# Patient Record
Sex: Male | Born: 1980 | Race: White | Hispanic: No | Marital: Single | Smoking: Current every day smoker
Health system: Southern US, Community
[De-identification: ages and names within clinical notes are randomized; demographics above are authoritative.]

---

## 2004-10-23 ENCOUNTER — Emergency Department: Payer: Self-pay | Admitting: Emergency Medicine

## 2005-11-13 ENCOUNTER — Emergency Department: Payer: Self-pay | Admitting: Unknown Physician Specialty

## 2010-01-25 ENCOUNTER — Emergency Department: Payer: Self-pay | Admitting: Emergency Medicine

## 2010-02-12 ENCOUNTER — Emergency Department: Payer: Self-pay | Admitting: Emergency Medicine

## 2010-02-14 ENCOUNTER — Emergency Department: Payer: Self-pay | Admitting: Emergency Medicine

## 2010-05-03 ENCOUNTER — Emergency Department: Payer: Self-pay | Admitting: Emergency Medicine

## 2010-07-27 ENCOUNTER — Emergency Department: Payer: Self-pay | Admitting: Unknown Physician Specialty

## 2011-01-19 ENCOUNTER — Emergency Department: Payer: Self-pay | Admitting: Emergency Medicine

## 2011-03-18 ENCOUNTER — Emergency Department: Payer: Self-pay | Admitting: Emergency Medicine

## 2012-08-01 ENCOUNTER — Emergency Department: Payer: Self-pay | Admitting: Emergency Medicine

## 2012-08-01 LAB — URINALYSIS, COMPLETE
Bacteria: NONE SEEN
Bilirubin,UR: NEGATIVE
Blood: NEGATIVE
Glucose,UR: NEGATIVE mg/dL (ref 0–75)
Leukocyte Esterase: NEGATIVE
Nitrite: NEGATIVE
Ph: 6 (ref 4.5–8.0)
Protein: NEGATIVE
RBC,UR: 1 /HPF (ref 0–5)
Specific Gravity: 1.026 (ref 1.003–1.030)
Squamous Epithelial: NONE SEEN
WBC UR: 1 /HPF (ref 0–5)

## 2012-08-01 LAB — COMPREHENSIVE METABOLIC PANEL
Albumin: 4.3 g/dL (ref 3.4–5.0)
Alkaline Phosphatase: 80 U/L (ref 50–136)
Anion Gap: 7 (ref 7–16)
BUN: 12 mg/dL (ref 7–18)
Bilirubin,Total: 0.6 mg/dL (ref 0.2–1.0)
Calcium, Total: 9.4 mg/dL (ref 8.5–10.1)
Chloride: 103 mmol/L (ref 98–107)
Co2: 28 mmol/L (ref 21–32)
Creatinine: 0.89 mg/dL (ref 0.60–1.30)
EGFR (African American): >60
EGFR (Non-African Amer.): >60
Glucose: 111 mg/dL — ABNORMAL HIGH (ref 65–99)
Osmolality: 276 (ref 275–301)
Potassium: 3.6 mmol/L (ref 3.5–5.1)
SGOT(AST): 15 U/L (ref 15–37)
SGPT (ALT): 24 U/L (ref 12–78)
Sodium: 138 mmol/L (ref 136–145)
Total Protein: 8.3 g/dL — ABNORMAL HIGH (ref 6.4–8.2)

## 2012-08-01 LAB — CBC WITH DIFFERENTIAL/PLATELET
Basophil #: 0 10*3/uL (ref 0.0–0.1)
Basophil %: 0.3 %
Eosinophil #: 0.1 10*3/uL (ref 0.0–0.7)
Eosinophil %: 0.8 %
HCT: 48.5 % (ref 40.0–52.0)
HGB: 16.7 g/dL (ref 13.0–18.0)
Lymphocyte #: 1.1 10*3/uL (ref 1.0–3.6)
Lymphocyte %: 11.3 %
MCH: 30.4 pg (ref 26.0–34.0)
MCHC: 34.5 g/dL (ref 32.0–36.0)
MCV: 88 fL (ref 80–100)
Monocyte #: 0.5 x10 3/mm (ref 0.2–1.0)
Monocyte %: 5.4 %
Neutrophil #: 7.7 10*3/uL — ABNORMAL HIGH (ref 1.4–6.5)
Neutrophil %: 82.2 %
Platelet: 284 10*3/uL (ref 150–440)
RBC: 5.5 10*6/uL (ref 4.40–5.90)
RDW: 13.2 % (ref 11.5–14.5)
WBC: 9.4 10*3/uL (ref 3.8–10.6)

## 2012-08-01 LAB — LIPASE, BLOOD: Lipase: 70 U/L — ABNORMAL LOW (ref 73–393)

## 2013-12-29 ENCOUNTER — Emergency Department: Payer: Self-pay | Admitting: Emergency Medicine

## 2013-12-29 LAB — D-DIMER(ARMC): D-Dimer: 1018 ng/ml

## 2013-12-29 LAB — URINALYSIS, COMPLETE
Bacteria: NONE SEEN
Bilirubin,UR: NEGATIVE
Blood: NEGATIVE
Glucose,UR: NEGATIVE mg/dL (ref 0–75)
Ketone: NEGATIVE
Leukocyte Esterase: NEGATIVE
Nitrite: NEGATIVE
Ph: 5 (ref 4.5–8.0)
Protein: NEGATIVE
RBC,UR: 3 /HPF (ref 0–5)
Specific Gravity: 1.028 (ref 1.003–1.030)
Squamous Epithelial: NONE SEEN
WBC UR: 1 /HPF (ref 0–5)

## 2013-12-29 LAB — BASIC METABOLIC PANEL
Anion Gap: 4 — ABNORMAL LOW (ref 7–16)
BUN: 12 mg/dL (ref 7–18)
Calcium, Total: 8.6 mg/dL (ref 8.5–10.1)
Chloride: 106 mmol/L (ref 98–107)
Co2: 30 mmol/L (ref 21–32)
Creatinine: 1.14 mg/dL (ref 0.60–1.30)
EGFR (African American): >60
EGFR (Non-African Amer.): >60
Glucose: 93 mg/dL (ref 65–99)
Osmolality: 279 (ref 275–301)
Potassium: 3.7 mmol/L (ref 3.5–5.1)
Sodium: 140 mmol/L (ref 136–145)

## 2013-12-29 LAB — CBC
HCT: 44.1 % (ref 40.0–52.0)
HGB: 14.9 g/dL (ref 13.0–18.0)
MCH: 30.6 pg (ref 26.0–34.0)
MCHC: 33.8 g/dL (ref 32.0–36.0)
MCV: 91 fL (ref 80–100)
Platelet: 242 10*3/uL (ref 150–440)
RBC: 4.87 10*6/uL (ref 4.40–5.90)
RDW: 13.6 % (ref 11.5–14.5)
WBC: 8.1 10*3/uL (ref 3.8–10.6)

## 2013-12-29 LAB — TROPONIN I
Troponin-I: 0.04 ng/mL
Troponin-I: 0.04 ng/mL

## 2013-12-29 LAB — CK TOTAL AND CKMB (NOT AT ARMC)
CK, Total: 108 U/L
CK-MB: 1.1 ng/mL (ref 0.5–3.6)

## 2014-03-23 ENCOUNTER — Emergency Department: Payer: Self-pay | Admitting: Emergency Medicine

## 2014-03-23 LAB — CBC WITH DIFFERENTIAL/PLATELET
Basophil #: 0 10*3/uL (ref 0.0–0.1)
Basophil %: 0.4 %
Eosinophil #: 0.4 10*3/uL (ref 0.0–0.7)
Eosinophil %: 4.1 %
HCT: 45.7 % (ref 40.0–52.0)
HGB: 15.1 g/dL (ref 13.0–18.0)
Lymphocyte #: 2.6 10*3/uL (ref 1.0–3.6)
Lymphocyte %: 29 %
MCH: 29.8 pg (ref 26.0–34.0)
MCHC: 33.1 g/dL (ref 32.0–36.0)
MCV: 90 fL (ref 80–100)
Monocyte #: 0.9 x10 3/mm (ref 0.2–1.0)
Monocyte %: 10.6 %
Neutrophil #: 5 10*3/uL (ref 1.4–6.5)
Neutrophil %: 55.9 %
Platelet: 276 10*3/uL (ref 150–440)
RBC: 5.07 10*6/uL (ref 4.40–5.90)
RDW: 14 % (ref 11.5–14.5)
WBC: 8.9 10*3/uL (ref 3.8–10.6)

## 2014-03-23 LAB — BASIC METABOLIC PANEL
Anion Gap: 8 (ref 7–16)
BUN: 21 mg/dL — ABNORMAL HIGH (ref 7–18)
Calcium, Total: 8.9 mg/dL (ref 8.5–10.1)
Chloride: 100 mmol/L (ref 98–107)
Co2: 27 mmol/L (ref 21–32)
Creatinine: 1.09 mg/dL (ref 0.60–1.30)
EGFR (African American): >60
EGFR (Non-African Amer.): >60
Glucose: 116 mg/dL — ABNORMAL HIGH (ref 65–99)
Osmolality: 274 (ref 275–301)
Potassium: 3.5 mmol/L (ref 3.5–5.1)
Sodium: 135 mmol/L — ABNORMAL LOW (ref 136–145)

## 2014-03-23 LAB — URINALYSIS, COMPLETE
Bilirubin,UR: NEGATIVE
Blood: NEGATIVE
Glucose,UR: NEGATIVE mg/dL (ref 0–75)
Ketone: NEGATIVE
Leukocyte Esterase: NEGATIVE
Nitrite: NEGATIVE
Ph: 5 (ref 4.5–8.0)
Protein: NEGATIVE
RBC,UR: 2 /HPF (ref 0–5)
Specific Gravity: 1.032 (ref 1.003–1.030)
Squamous Epithelial: NONE SEEN
WBC UR: 6 /HPF (ref 0–5)

## 2014-05-19 ENCOUNTER — Emergency Department: Payer: Self-pay | Admitting: Emergency Medicine

## 2014-05-19 LAB — BASIC METABOLIC PANEL
Anion Gap: 7 (ref 7–16)
BUN: 10 mg/dL (ref 7–18)
Calcium, Total: 9 mg/dL (ref 8.5–10.1)
Chloride: 106 mmol/L (ref 98–107)
Co2: 28 mmol/L (ref 21–32)
Creatinine: 0.93 mg/dL (ref 0.60–1.30)
EGFR (African American): >60
EGFR (Non-African Amer.): >60
Glucose: 83 mg/dL (ref 65–99)
Osmolality: 279 (ref 275–301)
Potassium: 3.9 mmol/L (ref 3.5–5.1)
Sodium: 141 mmol/L (ref 136–145)

## 2014-05-19 LAB — URINALYSIS, COMPLETE
Bilirubin,UR: NEGATIVE
Glucose,UR: NEGATIVE mg/dL (ref 0–75)
Ketone: NEGATIVE
Leukocyte Esterase: NEGATIVE
Nitrite: NEGATIVE
Ph: 5 (ref 4.5–8.0)
Protein: NEGATIVE
RBC,UR: 217 /HPF (ref 0–5)
Specific Gravity: 1.018 (ref 1.003–1.030)
Squamous Epithelial: 2
WBC UR: 5 /HPF (ref 0–5)

## 2014-05-19 LAB — GC/CHLAMYDIA PROBE AMP

## 2014-05-19 LAB — CBC
HCT: 49 % (ref 40.0–52.0)
HGB: 16.1 g/dL (ref 13.0–18.0)
MCH: 30.3 pg (ref 26.0–34.0)
MCHC: 33 g/dL (ref 32.0–36.0)
MCV: 92 fL (ref 80–100)
Platelet: 241 10*3/uL (ref 150–440)
RBC: 5.34 10*6/uL (ref 4.40–5.90)
RDW: 13.2 % (ref 11.5–14.5)
WBC: 6 10*3/uL (ref 3.8–10.6)

## 2014-05-19 LAB — CK: CK, Total: 77 U/L

## 2014-05-21 LAB — URINE CULTURE

## 2015-01-15 ENCOUNTER — Emergency Department
Admit: 2015-01-15 | Disposition: A | Discharge status: Home / Self Care | Payer: Self-pay | Admitting: Emergency Medicine

## 2015-04-12 ENCOUNTER — Emergency Department
Admission: EM | Admit: 2015-04-12 | Discharge: 2015-04-12 | Disposition: A | Discharge status: Home / Self Care | Payer: Self-pay | Attending: Emergency Medicine | Admitting: Emergency Medicine

## 2015-04-12 ENCOUNTER — Encounter: Payer: Self-pay | Admitting: Emergency Medicine

## 2015-04-12 DIAGNOSIS — Y288XXA Contact with other sharp object, undetermined intent, initial encounter: Secondary | ICD-10-CM | POA: Insufficient documentation

## 2015-04-12 DIAGNOSIS — Y998 Other external cause status: Secondary | ICD-10-CM | POA: Insufficient documentation

## 2015-04-12 DIAGNOSIS — Y9289 Other specified places as the place of occurrence of the external cause: Secondary | ICD-10-CM | POA: Insufficient documentation

## 2015-04-12 DIAGNOSIS — S51011A Laceration without foreign body of right elbow, initial encounter: Secondary | ICD-10-CM | POA: Insufficient documentation

## 2015-04-12 DIAGNOSIS — Y9389 Activity, other specified: Secondary | ICD-10-CM | POA: Insufficient documentation

## 2015-04-12 MED ORDER — BACITRACIN-NEOMYCIN-POLYMYXIN 400-5-5000 EX OINT
TOPICAL_OINTMENT | CUTANEOUS | Status: AC
  Administered 2015-04-12: 1 via TOPICAL
  Filled 2015-04-12: qty 1

## 2015-04-12 MED ORDER — TRIPLE ANTIBIOTIC 3.5-400-5000 EX OINT
TOPICAL_OINTMENT | Freq: Once | CUTANEOUS | Status: AC
  Administered 2015-04-12: 1 via TOPICAL

## 2015-04-12 MED ORDER — OXYCODONE-ACETAMINOPHEN 5-325 MG PO TABS
ORAL_TABLET | ORAL | Status: AC
  Administered 2015-04-12: 1 via ORAL
  Filled 2015-04-12: qty 1

## 2015-04-12 MED ORDER — TRAMADOL HCL 50 MG PO TABS: 50.0000 mg | ORAL_TABLET | Freq: Four times a day (QID) | ORAL | Status: DC | PRN

## 2015-04-12 MED ORDER — IBUPROFEN 800 MG PO TABS: 800.0000 mg | ORAL_TABLET | Freq: Three times a day (TID) | ORAL | Status: DC | PRN

## 2015-04-12 MED ORDER — OXYCODONE-ACETAMINOPHEN 5-325 MG PO TABS
1.0000 | ORAL_TABLET | Freq: Once | ORAL | Status: AC
  Administered 2015-04-12: 1 via ORAL

## 2015-04-12 MED ORDER — LIDOCAINE-EPINEPHRINE (PF) 1 %-1:200000 IJ SOLN
INTRAMUSCULAR | Status: AC
  Filled 2015-04-12: qty 30

## 2015-04-12 NOTE — ED Provider Notes (Signed)
[facility] Emergency Department Provider Note  ____________________________________________  Time seen: Approximately 2:06 PM  I have reviewed the triage vital signs and the nursing notes.   HISTORY  Chief Complaint Extremity Laceration    HPI Judeth HornJason A Park is a 34 y.o. male right lateral posterior elbow laceration. Area was cut with a weed trimmer blade. He which is controlled with direct pressure. Patient denies any loss of sensation or loss of function of the affected extremity. Patient is rating his pain as 7/10.   History reviewed. No pertinent past medical history.  There are no active problems to display for this patient.   History reviewed. No pertinent past surgical history.  No current outpatient prescriptions on file.  Allergies Review of patient's allergies indicates no known allergies.  No family history on file.  Social History History  Substance Use Topics  . Smoking status: Not on file  . Smokeless tobacco: Not on file  . Alcohol Use: Not on file    Review of Systems Constitutional: No fever/chills Eyes: No visual changes. ENT: No sore throat. Cardiovascular: Denies chest pain. Respiratory: Denies shortness of breath. Gastrointestinal: No abdominal pain.  No nausea, no vomiting.  No diarrhea.  No constipation. Genitourinary: Negative for dysuria. Musculoskeletal: Negative for back pain. Skin: Laceration to the right elbow. Neurological: Negative for headaches, focal weakness or numbness. 10-point ROS otherwise negative.  ____________________________________________   PHYSICAL EXAM:  VITAL SIGNS: ED Triage Vitals  Enc Vitals Group     BP 04/12/15 1403 122/67 mmHg     Pulse Rate 04/12/15 1403 80     Resp 04/12/15 1403 18     Temp 04/12/15 1403 98.2 F (36.8 C)     Temp Source 04/12/15 1403 Oral     SpO2 04/12/15 1403 100 %     Weight 04/12/15 1403 196 lb (88.905 kg)     Height 04/12/15 1403 6\' 2"  (1.88 m)     Head Cir --      Peak Flow --      Pain Score 04/12/15 1404 7     Pain Loc --      Pain Edu? --      Excl. in GC? --    Constitutional: Alert and oriented. Well appearing and in no acute distress. Eyes: Conjunctivae are normal. PERRL. EOMI. Head: Atraumatic. Nose: No congestion/rhinnorhea. Mouth/Throat: Mucous membranes are moist.  Oropharynx non-erythematous. Neck: No stridor. No cervical spine tenderness to palpation. Hematological/Lymphatic/Immunilogical: No cervical lymphadenopathy. Cardiovascular: Normal rate, regular rhythm. Grossly normal heart sounds.  Good peripheral circulation. Respiratory: Normal respiratory effort.  No retractions. Lungs CTAB. Gastrointestinal: Soft and nontender. No distention. No abdominal bruits. No CVA tenderness. Musculoskeletal: No lower extremity tenderness nor edema.  No joint effusions. Neurologic:  Normal speech and language. No gross focal neurologic deficits are appreciated. Speech is normal. No gait instability. Skin:  Skin is warm, dry and intact. No rash noted. 3 center laceration to the right lateral posterior elbow. Extremities neurovascularly intact free nuchal range of motion. No hemorrhaging at this time. Psychiatric: Mood and affect are normal. Speech and behavior are normal.  ____________________________________________   LABS (all labs ordered are listed, but only abnormal results are displayed)  Labs Reviewed - No data to display ____________________________________________  EKG   ____________________________________________  RADIOLOGY   ____________________________________________   PROCEDURES  Procedure(s) performed: None  Critical Care performed: No  ____________________________________________   INITIAL IMPRESSION / ASSESSMENT AND PLAN / ED COURSE  Pertinent labs & imaging results  that were available during my care of the patient were reviewed by me and considered in my medical decision making (see chart  for details).  LACERATION REPAIR PerformedJoni Reining K Smith Authorized by: Joni Reining Consent: Verbal consent obtained. Risks and benefits: risks, benefits and alternatives were discussed Consent given by: patient Patient identity confirmed: provided demographic data Prepped and Draped in normal sterile fashion Wound explored  Laceration Location: Right lateral posterior elbow  Laceration Length: 3 cm  No Foreign Bodies seen or palpated  Anesthesia: local infiltration  Local anesthetic: lidocaine1% with epinephrine  Anesthetic total: 6 ml  Irrigation method: syringe Amount of cleaning: standard  Skin closure 3-0 nylon  Number of sutures: 8  Technique: Interrupted Patient tolerance: Patient tolerated the procedure well with no immediate complications.  ____________________________________________   FINAL CLINICAL IMPRESSION(S) / ED DIAGNOSES  Final diagnoses:  Elbow laceration, right, initial encounter      Joni Reining, PA-C 04/12/15 1431  Myrna Blazer, MD 04/12/15 [phone]

## 2015-04-12 NOTE — ED Notes (Signed)
D/c instructions reviewed w/ pt - pt denies any further questions or concerns at present.  Pt instructed to not use alcohol, drive, or operate heavy machinery while take the prescription pain medications as they could make him drowsy - pt verbalized understanding.   

## 2015-04-12 NOTE — ED Notes (Signed)
Dressed wound with tefla and kerlex

## 2015-04-12 NOTE — ED Notes (Signed)
From home: right lateral/posterior elbow lac 3 cm, deep, bleeding controlled, Ron at bedside suturing.  Lacerated with weed trimmer blade, was trimming high limbs over fence.  VSS

## 2015-04-22 ENCOUNTER — Emergency Department
Admission: EM | Admit: 2015-04-22 | Discharge: 2015-04-22 | Disposition: A | Discharge status: Home / Self Care | Payer: Self-pay | Attending: Emergency Medicine | Admitting: Emergency Medicine

## 2015-04-22 DIAGNOSIS — Z4802 Encounter for removal of sutures: Secondary | ICD-10-CM | POA: Insufficient documentation

## 2015-04-22 NOTE — ED Provider Notes (Signed)
[facility] Emergency Department Provider Note  ____________________________________________  Time seen:  2:27 PM  I have reviewed the triage vital signs and the nursing notes.   HISTORY  Chief Complaint No chief complaint on file.    HPI Marcus Park is a 34 y.o. male is here for suture removal. He was here on 04/12/15 for laceration to his right posterior elbow. He states he has not had any problems with this. He denies any pain or fever.   No past medical history on file.  There are no active problems to display for this patient.   No past surgical history on file.  Current Outpatient Rx  Name  Route  Sig  Dispense  Refill  . ibuprofen (ADVIL,MOTRIN) 800 MG tablet   Oral   Take 1 tablet (800 mg total) by mouth every 8 (eight) hours as needed for moderate pain.   15 tablet   0   . traMADol (ULTRAM) 50 MG tablet   Oral   Take 1 tablet (50 mg total) by mouth every 6 (six) hours as needed for moderate pain.   12 tablet   0     Allergies Review of patient's allergies indicates no known allergies.  No family history on file.  Social History History  Substance Use Topics  . Smoking status: Not on file  . Smokeless tobacco: Not on file  . Alcohol Use: Not on file    Review of Systems Constitutional: No fever/chills   10-point ROS otherwise negative.  ____________________________________________   PHYSICAL EXAM:  VITAL SIGNS: ED Triage Vitals  Enc Vitals Group     BP 04/22/15 1419 115/64 mmHg     Pulse Rate 04/22/15 1419 80     Resp 04/22/15 1419 18     Temp 04/22/15 1419 98 F (36.7 C)     Temp Source 04/22/15 1419 Oral     SpO2 04/22/15 1419 98 %     Weight 04/22/15 1419 196 lb (88.905 kg)     Height 04/22/15 1419  (1.854 m)     Head Cir --      Peak Flow --      Pain Score --      Pain Loc --      Pain Edu? --      Excl. in GC? --     Constitutional: Alert and oriented. Well appearing and in no acute  distress. Eyes: Conjunctivae are normal. PERRL. EOMI. Head: Atraumatic. Nose: No congestion/rhinnorhea. Neck: No stridor.   Musculoskeletal: No lower extremity tenderness nor edema.  No joint effusions. Elbow within normal limits range of motion. Neurologic:  Normal speech and language. No gross focal neurologic deficits are appreciated. Speech is normal. No gait instability. Skin:  Skin is warm, dry and intact. No rash noted. Area feels to have healed without any signs of infection. Psychiatric: Mood and affect are normal. Speech and behavior are normal.  ____________________________________________   LABS (all labs ordered are listed, but only abnormal results are displayed)  Labs Reviewed - No data to display PROCEDURES  Procedure(s) performed: None  Critical Care performed: No  ____________________________________________   INITIAL IMPRESSION / ASSESSMENT AND PLAN / ED COURSE  Pertinent labs & imaging results that were available during my care of the patient were reviewed by me and considered in my medical decision making (see chart for details).  Sutures were removed by RN. Patient is return if any continued problems. ____________________________________________   FINAL CLINICAL IMPRESSION(S) /  ED DIAGNOSES  Final diagnoses:  Visit for suture removal      Tommi RumpsRhonda L Carvel Huskins, PA-C 04/22/15 1434  Phineas SemenGraydon Goodman, MD 04/22/15 1447

## 2015-04-22 NOTE — Discharge Instructions (Signed)

## 2015-04-22 NOTE — ED Notes (Signed)
After sutures removed, steri strips applied

## 2015-04-23 ENCOUNTER — Encounter: Payer: Self-pay | Admitting: Emergency Medicine

## 2015-04-23 ENCOUNTER — Emergency Department
Admission: EM | Admit: 2015-04-23 | Discharge: 2015-04-24 | Disposition: A | Discharge status: Home / Self Care | Payer: Self-pay | Attending: Emergency Medicine | Admitting: Emergency Medicine

## 2015-04-23 DIAGNOSIS — Y93E1 Activity, personal bathing and showering: Secondary | ICD-10-CM | POA: Insufficient documentation

## 2015-04-23 DIAGNOSIS — Y998 Other external cause status: Secondary | ICD-10-CM | POA: Insufficient documentation

## 2015-04-23 DIAGNOSIS — L539 Erythematous condition, unspecified: Secondary | ICD-10-CM | POA: Insufficient documentation

## 2015-04-23 DIAGNOSIS — R55 Syncope and collapse: Secondary | ICD-10-CM | POA: Insufficient documentation

## 2015-04-23 DIAGNOSIS — S300XXA Contusion of lower back and pelvis, initial encounter: Secondary | ICD-10-CM | POA: Insufficient documentation

## 2015-04-23 DIAGNOSIS — W182XXA Fall in (into) shower or empty bathtub, initial encounter: Secondary | ICD-10-CM | POA: Insufficient documentation

## 2015-04-23 DIAGNOSIS — Z720 Tobacco use: Secondary | ICD-10-CM | POA: Insufficient documentation

## 2015-04-23 DIAGNOSIS — Y9289 Other specified places as the place of occurrence of the external cause: Secondary | ICD-10-CM | POA: Insufficient documentation

## 2015-04-23 DIAGNOSIS — S20211A Contusion of right front wall of thorax, initial encounter: Secondary | ICD-10-CM | POA: Insufficient documentation

## 2015-04-23 LAB — BASIC METABOLIC PANEL
Anion gap: 13 (ref 5–15)
BUN: 14 mg/dL (ref 6–20)
CO2: 23 mmol/L (ref 22–32)
Calcium: 9.4 mg/dL (ref 8.9–10.3)
Chloride: 100 mmol/L — ABNORMAL LOW (ref 101–111)
Creatinine, Ser: 1.21 mg/dL (ref 0.61–1.24)
GFR calc Af Amer: >60 mL/min (ref >60)
GFR calc non Af Amer: >60 mL/min (ref >60)
Glucose, Bld: 93 mg/dL (ref 65–99)
Potassium: 3.5 mmol/L (ref 3.5–5.1)
Sodium: 136 mmol/L (ref 135–145)

## 2015-04-23 LAB — URINALYSIS COMPLETE WITH MICROSCOPIC (ARMC ONLY)
Bilirubin Urine: NEGATIVE
Glucose, UA: NEGATIVE mg/dL
Hgb urine dipstick: NEGATIVE
Leukocytes, UA: NEGATIVE
Nitrite: NEGATIVE
Protein, ur: 100 mg/dL — AB
Specific Gravity, Urine: 1.028 (ref 1.005–1.030)
pH: 5 (ref 5.0–8.0)

## 2015-04-23 LAB — CBC
HCT: 48.6 % (ref 40.0–52.0)
Hemoglobin: 16.4 g/dL (ref 13.0–18.0)
MCH: 29.9 pg (ref 26.0–34.0)
MCHC: 33.8 g/dL (ref 32.0–36.0)
MCV: 88.6 fL (ref 80.0–100.0)
Platelets: 286 10*3/uL (ref 150–440)
RBC: 5.49 MIL/uL (ref 4.40–5.90)
RDW: 13.9 % (ref 11.5–14.5)
WBC: 10.8 10*3/uL — ABNORMAL HIGH (ref 3.8–10.6)

## 2015-04-23 LAB — GLUCOSE, CAPILLARY: Glucose-Capillary: 90 mg/dL (ref 65–99)

## 2015-04-23 MED ORDER — OXYCODONE-ACETAMINOPHEN 5-325 MG PO TABS
2.0000 | ORAL_TABLET | Freq: Once | ORAL | Status: AC
Start: 2015-04-23 — End: 2015-04-23
  Administered 2015-04-23: 2 via ORAL
  Filled 2015-04-23: qty 2

## 2015-04-23 MED ORDER — SODIUM CHLORIDE 0.9 % IV BOLUS (SEPSIS)
1000.0000 mL | Freq: Once | INTRAVENOUS | Status: AC
  Administered 2015-04-23: 1000 mL via INTRAVENOUS

## 2015-04-23 MED ORDER — HYDROCODONE-ACETAMINOPHEN 5-325 MG PO TABS: 1.0000 | ORAL_TABLET | ORAL | Status: DC | PRN

## 2015-04-23 MED ORDER — ONDANSETRON HCL 4 MG/2ML IJ SOLN
4.0000 mg | Freq: Once | INTRAMUSCULAR | Status: AC
  Administered 2015-04-23: 4 mg via INTRAVENOUS
  Filled 2015-04-23: qty 2

## 2015-04-23 NOTE — ED Notes (Addendum)
Pt to rm 8 via EMS.  EMS reports syncopal episode.  Pt reports he felt lightheaded before episode.  Pt was in bathroom and fell.  Pt reports per girlfriend, only out few seconds.  Pt reports pain to right back and right shoulder blade, small red area noted on right back.  VS stable en route, CBG w/ EMS 63.  Pt reports he was in the sun a lot today and did not eat very much.  Pt NAD at this time.

## 2015-04-23 NOTE — Discharge Instructions (Signed)
Syncope Syncope means a person passes out (faints). The person usually wakes up in less than 5 minutes. It is important to seek medical care for syncope. HOME CARE  Have someone stay with you until you feel normal.  Do not drive, use machines, or play sports until your doctor says it is okay.  Keep all doctor visits as told.  Lie down when you feel like you might pass out. Take deep breaths. Wait until you feel normal before standing up.  Drink enough fluids to keep your pee (urine) clear or pale yellow.  If you take blood pressure or heart medicine, get up slowly. Take several minutes to sit and then stand. GET HELP RIGHT AWAY IF:   You have a severe headache.  You have pain in the chest, belly (abdomen), or back.  You are bleeding from the mouth or butt (rectum).  You have black or tarry poop (stool).  You have an irregular or very fast heartbeat.  You have pain with breathing.  You keep passing out, or you have shaking (seizures) when you pass out.  You pass out when sitting or lying down.  You feel confused.  You have trouble walking.  You have severe weakness.  You have vision problems. If you fainted, call your local emergency services (911 in [place] Do not drive yourself to the hospital. MAKE SURE YOU:   Understand these instructions.  Will watch your condition.  Will get help right away if you are not doing well or get worse. Document Released: 03/16/2008 Document Revised: 03/29/2012 Document Reviewed: 11/27/2011 [employer] Patient Information 2015 [employer] [place]. This information is not intended to replace advice given to you by your health care provider. Make sure you discuss any questions you have with your health care provider.    As we have discussed please drink plenty of fluids of the next several days. Please follow-up with her primary care provider this week for recheck. Return to the emergency department for any further syncopal episodes up (if  you pass out), or any chest pain, or shortness breath.

## 2015-04-23 NOTE — ED Provider Notes (Signed)
[facility] Emergency Department Provider Note  Time seen: 9:53 PM  I have reviewed the triage vital signs and the nursing notes.   HISTORY  Chief Complaint Loss of Consciousness    HPI Marcus Park is a 34 y.o. male with no past medical history who presents to the emergency department after a syncopal episode. According to the patient he had been out mowing lawns for the last 4-5 hours. He went to take a shower, and while entering the shower became very dizzy and lightheaded. He awoke on the floor with his girlfriend standing over him who states she heard a thump and came to the room. Patient believes he lost consciousness but states it was only brief. Denies any history of syncope in the past. Denies any chest pain or shortness of breath at any time. Denies any nausea at any time. Denies abdominal pain. Currently the patient's only complaint is of back pain where he has a bruise from the fall tonight. Denies any headache or head pain. Denies neck pain.     History reviewed. No pertinent past medical history.  There are no active problems to display for this patient.   History reviewed. No pertinent past surgical history.  Current Outpatient Rx  Name  Route  Sig  Dispense  Refill  . ibuprofen (ADVIL,MOTRIN) 800 MG tablet   Oral   Take 1 tablet (800 mg total) by mouth every 8 (eight) hours as needed for moderate pain.   15 tablet   0   . traMADol (ULTRAM) 50 MG tablet   Oral   Take 1 tablet (50 mg total) by mouth every 6 (six) hours as needed for moderate pain.   12 tablet   0     Allergies Review of patient's allergies indicates no known allergies.  History reviewed. No pertinent family history.  Social History History  Substance Use Topics  . Smoking status: Current Every Day Smoker -- 1.00 packs/day    Types: Cigarettes  . Smokeless tobacco: Never Used  . Alcohol Use: No    Review of Systems Constitutional: Negative for fever.  [place] for syncope tonight. Cardiovascular: Negative for chest pain. Respiratory: Negative for shortness of breath. Gastrointestinal: Negative for abdominal pain, vomiting and diarrhea. Musculoskeletal: Negative for back pain. Skin: Bruises to back.  10-point ROS otherwise negative.  ____________________________________________   PHYSICAL EXAM:  VITAL SIGNS: ED Triage Vitals  Enc Vitals Group     BP 04/23/15 2132 110/76 mmHg     Pulse Rate 04/23/15 2132 87     Resp 04/23/15 2132 18     Temp 04/23/15 2132 98 F (36.7 C)     Temp Source 04/23/15 2132 Oral     SpO2 04/23/15 2132 98 %     Weight 04/23/15 2132 196 lb (88.905 kg)     Height 04/23/15 2132 6\' 1"  (1.854 m)     Head Cir --      Peak Flow --      Pain Score 04/23/15 2132 7     Pain Loc --      Pain Edu? --      Excl. in GC? --     Constitutional: Alert and oriented. Well appearing and in no distress. Eyes: Normal exam ENT   Head: Normocephalic and atraumatic. No C-spine tenderness to palpation.   Mouth/Throat: Mucous membranes are moist. Cardiovascular: Normal rate, regular rhythm. No murmur Respiratory: Normal respiratory effort without tachypnea nor retractions. Breath sounds are clear. Patient does  have an area of tenderness with erythema consistent with contusion to the right posterior chest. No midline tenderness to palpation. Gastrointestinal: Soft and nontender. No distention.   Musculoskeletal: Nontender with normal range of motion in all extremities. No lower extremity tenderness or edema. Bruises to back as stated above. Neurologic:  Normal speech and language. No gross focal neurologic deficits Skin:  Skin is warm, dry and intact.  Psychiatric: Mood and affect are normal. Speech and behavior are normal.   ____________________________________________    EKG  EKG reviewed and interpreted by myself shows normal sinus rhythm at 81 bpm, narrow QRS, normal axis, normal intervals. The patient does  have ST elevations present in the 2 through V6. No reciprocal depressions. In reviewing the patient's history his EKG from 12/29/2013 shows a very similar appearance. As the patient has no chest pain, with noticeable changes, do not suspect ACS.  ____________________________________________    INITIAL IMPRESSION / ASSESSMENT AND PLAN / ED COURSE  Pertinent labs & imaging results that were available during my care of the patient were reviewed by me and considered in my medical decision making (see chart for details).  Patient with syncopal episode today after mowing lawns in the heat for 4-5 hours. We will check labs including cardiac enzymes) likely 2 sets), IV hydrate, and treat pain. Patient does have a notable bruise to the posterior right chest. No vertebral midline tenderness to palpation. Denies any headache, no neck pain. Do not believe the patient requires further imaging at this time.     ----------------------------------------- 11:23 PM on 04/23/2015 -----------------------------------------  Patient feeling much better after fluids. Labs are largely within normal limits. Currently awaiting troponin results, we will add on a second troponin at 12:30 AM. If normal we will discharge the patient home with primary care follow-up and pain medication for back pain. I discussed strict return precautions with the patient, as well as increasing by mouth fluids to greater than 64 ounces per day. Patient is agreeable to plan.  Patient care signed out to Dr. Zenda Alpers.  ____________________________________________   FINAL CLINICAL IMPRESSION(S) / ED DIAGNOSES  Syncope   Minna Antis, MD 04/23/15 2324

## 2015-04-24 LAB — TROPONIN I
Troponin I: <0.03 ng/mL (ref <0.031)
Troponin I: <0.03 ng/mL (ref <0.031)

## 2015-04-24 MED ORDER — OXYCODONE-ACETAMINOPHEN 5-325 MG PO TABS
ORAL_TABLET | ORAL | Status: AC
  Administered 2015-04-24: 1 via ORAL
  Filled 2015-04-24: qty 1

## 2015-04-24 MED ORDER — OXYCODONE-ACETAMINOPHEN 5-325 MG PO TABS
1.0000 | ORAL_TABLET | Freq: Once | ORAL | Status: AC
  Administered 2015-04-24: 1 via ORAL

## 2015-04-24 NOTE — ED Provider Notes (Signed)
-----------------------------------------   1:44 AM on 04/24/2015 -----------------------------------------  Assuming care from Dr. Lenard LancePaduchowski.  In short, Marcus Park is a 34 y.o. male with a chief complaint of Loss of Consciousness .  Refer to the original H&P for additional details.  The current plan of care is to follow-up the results of the patient's troponins, reassess him and discharged him if everything is normal..   [name] troponins were unremarkable. He'll be discharged home to follow-up. The patient did receive a dose of Percocet for his pain in his back.  Rebecka ApleyAllison P Webster, MD 04/24/15 [phone]

## 2015-05-02 ENCOUNTER — Encounter: Payer: Self-pay | Admitting: *Deleted

## 2015-05-02 ENCOUNTER — Emergency Department: Payer: Self-pay

## 2015-05-02 ENCOUNTER — Emergency Department
Admission: EM | Admit: 2015-05-02 | Discharge: 2015-05-02 | Disposition: A | Discharge status: Home / Self Care | Payer: Self-pay | Attending: Emergency Medicine | Admitting: Emergency Medicine

## 2015-05-02 DIAGNOSIS — W182XXA Fall in (into) shower or empty bathtub, initial encounter: Secondary | ICD-10-CM | POA: Insufficient documentation

## 2015-05-02 DIAGNOSIS — Z720 Tobacco use: Secondary | ICD-10-CM | POA: Insufficient documentation

## 2015-05-02 DIAGNOSIS — Y93E1 Activity, personal bathing and showering: Secondary | ICD-10-CM | POA: Insufficient documentation

## 2015-05-02 DIAGNOSIS — Y9289 Other specified places as the place of occurrence of the external cause: Secondary | ICD-10-CM | POA: Insufficient documentation

## 2015-05-02 DIAGNOSIS — Y998 Other external cause status: Secondary | ICD-10-CM | POA: Insufficient documentation

## 2015-05-02 DIAGNOSIS — R0602 Shortness of breath: Secondary | ICD-10-CM | POA: Insufficient documentation

## 2015-05-02 DIAGNOSIS — S298XXA Other specified injuries of thorax, initial encounter: Secondary | ICD-10-CM | POA: Insufficient documentation

## 2015-05-02 MED ORDER — OXYCODONE-ACETAMINOPHEN 5-325 MG PO TABS
2.0000 | ORAL_TABLET | Freq: Once | ORAL | Status: AC
Start: 2015-05-02 — End: 2015-05-02
  Administered 2015-05-02: 2 via ORAL
  Filled 2015-05-02: qty 2

## 2015-05-02 MED ORDER — IBUPROFEN 800 MG PO TABS
800.0000 mg | ORAL_TABLET | Freq: Three times a day (TID) | ORAL | Status: DC
Start: 2015-05-02 — End: 2020-04-18

## 2015-05-02 MED ORDER — OXYCODONE-ACETAMINOPHEN 5-325 MG PO TABS: 1.0000 | ORAL_TABLET | ORAL | Status: DC | PRN

## 2015-05-02 NOTE — ED Notes (Signed)
Pt to triage via wheelchair.  Pt has right mid back/side pain. Pt states he fell in the shower last week and struck the toilet.   Pt continues to have pain.  Pt was seen in the ER last week after the fall, states i'm not any better.

## 2015-05-02 NOTE — ED Provider Notes (Signed)
[facility] Emergency Department Provider Note  ____________________________________________  Time seen: Approximately 4:09 PM  I have reviewed the triage vital signs and the nursing notes.   HISTORY  Chief Complaint Back Pain   HPI Marcus Park is a 34 y.o. male is here today with complaint of right rib pain. Patient states he fell out of the shower last week and struck his chest on the toilet. He was seen in the emergency room on that day but did not have x-rays. He states that the ibuprofen helps some but nothing else seems to be helping his pain. Pain is right lateral without radiation. He continues to smoke cigarettes. Coughing and taking deep breaths as increases pain. Currently his pain is 9 out of 10.   No past medical history on file.  There are no active problems to display for this patient.   No past surgical history on file.  Current Outpatient Rx  Name  Route  Sig  Dispense  Refill  . ibuprofen (ADVIL,MOTRIN) 800 MG tablet   Oral   Take 1 tablet (800 mg total) by mouth 3 (three) times daily.   30 tablet   0   . oxyCODONE-acetaminophen (PERCOCET) 5-325 MG per tablet   Oral   Take 1 tablet by mouth every 4 (four) hours as needed for severe pain.   20 tablet   0     Allergies Review of patient's allergies indicates no known allergies.  No family history on file.  Social History History  Substance Use Topics  . Smoking status: Current Every Day Smoker -- 1.00 packs/day    Types: Cigarettes  . Smokeless tobacco: Never Used  . Alcohol Use: No    Review of Systems Constitutional: No fever/chills ENT: No sore throat. Cardiovascular: Positive right lateral chest pain. Respiratory: Positive shortness of breath secondary to chest pain. Gastrointestinal: No abdominal pain.  No nausea, no vomiting.  Genitourinary: Negative for dysuria. Musculoskeletal: Negative for back pain. Skin: Negative for rash. Neurological: Negative for  headaches, focal weakness or numbness.  10-point ROS otherwise negative.  ____________________________________________   PHYSICAL EXAM:  VITAL SIGNS: ED Triage Vitals  Enc Vitals Group     BP 05/02/15 1559 112/59 mmHg     Pulse Rate 05/02/15 1559 88     Resp 05/02/15 1559 22     Temp 05/02/15 1559 98.7 F (37.1 C)     Temp Source 05/02/15 1559 Oral     SpO2 05/02/15 1559 98 %     Weight 05/02/15 1559 196 lb (88.905 kg)     Height 05/02/15 1559  (1.854 m)     Head Cir --      Peak Flow --      Pain Score 05/02/15 1559 9     Pain Loc --      Pain Edu? --      Excl. in GC? --     Constitutional: Alert and oriented. Well appearing and in no acute distress. Eyes: Conjunctivae are normal. PERRL. EOMI. Head: Atraumatic. Nose: No congestion/rhinnorhea. Neck: No stridor.   Cardiovascular: Normal rate, regular rhythm. Grossly normal heart sounds.  Good peripheral circulation. Respiratory: Normal respiratory effort.  No retractions. Lungs CTAB. Gastrointestinal: Soft and nontender. No distention. Musculoskeletal: No lower extremity tenderness nor edema.  No joint effusions. Neurologic:  Normal speech and language. No gross focal neurologic deficits are appreciated. No gait instability. Skin:  Skin is warm, dry and intact. No rash noted. Psychiatric: Mood and affect  are normal. Speech and behavior are normal.  ____________________________________________   LABS (all labs ordered are listed, but only abnormal results are displayed)  Labs Reviewed - No data to display    RADIOLOGY  Right rib x-ray per radiologist and reviewed by me does show any rib fractures. No acute infiltrate or pulmonary edema. ____________________________________________   PROCEDURES  Procedure(s) performed: None  Critical Care performed: No  ____________________________________________   INITIAL IMPRESSION / ASSESSMENT AND PLAN / ED COURSE  Pertinent labs & imaging results that were  available during my care of the patient were reviewed by me and considered in my medical decision making (see chart for details).  Patient was given a prescription for Percocet. He is to follow-up with [facility] clinic if any continued problems. ____________________________________________   FINAL CLINICAL IMPRESSION(S) / ED DIAGNOSES  Final diagnoses:  Blunt chest trauma, initial encounter      Tommi Rumps, PA-C 05/02/15 1732  Emily Filbert, MD 05/02/15 2009

## 2015-05-02 NOTE — Discharge Instructions (Signed)
Blunt Chest Trauma °Blunt chest trauma is an injury caused by a blow to the chest. These chest injuries can be very painful. Blunt chest trauma often results in bruised or broken (fractured) ribs. Most cases of bruised and fractured ribs from blunt chest traumas get better after 1 to 3 weeks of rest and pain medicine. Often, the soft tissue in the chest wall is also injured, causing pain and bruising. Internal organs, such as the heart and lungs, may also be injured. Blunt chest trauma can lead to serious medical problems. This injury requires immediate medical care. °CAUSES  °· Motor vehicle collisions. °· Falls. °· Physical violence. °· Sports injuries. °SYMPTOMS  °· Chest pain. The pain may be worse when you move or breathe deeply. °· Shortness of breath. °· Lightheadedness. °· Bruising. °· Tenderness. °· Swelling. °DIAGNOSIS  °Your caregiver will do a physical exam. X-rays may be taken to look for fractures. However, minor rib fractures may not show up on X-rays until a few days after the injury. If a more serious injury is suspected, further imaging tests may be done. This may include ultrasounds, computed tomography (CT) scans, or magnetic resonance imaging (MRI). °TREATMENT  °Treatment depends on the severity of your injury. Your caregiver may prescribe pain medicines and deep breathing exercises. °HOME CARE INSTRUCTIONS °· Limit your activities until you can move around without much pain. °· Do not do any strenuous work until your injury is healed. °· Put ice on the injured area. °¨ Put ice in a plastic bag. °¨ Place a towel between your skin and the bag. °¨ Leave the ice on for 15-20 minutes, 03-04 times a day. °· You may wear a rib belt as directed by your caregiver to reduce pain. °· Practice deep breathing as directed by your caregiver to keep your lungs clear. °· Only take over-the-counter or prescription medicines for pain, fever, or discomfort as directed by your caregiver. °SEEK IMMEDIATE MEDICAL  CARE IF:  °· You have increasing pain or shortness of breath. °· You cough up blood. °· You have nausea, vomiting, or abdominal pain. °· You have a fever. °· You feel dizzy, weak, or you faint. °MAKE SURE YOU: °· Understand these instructions. °· Will watch your condition. °· Will get help right away if you are not doing well or get worse. °Document Released: 11/05/2004 Document Revised: 12/21/2011 Document Reviewed: 07/15/2011 °ExitCare® Patient Information ©2015 [employer]. This information is not intended to replace advice given to you by your health care provider. Make sure you discuss any questions you have with your health care provider. ° ° °

## 2016-07-31 ENCOUNTER — Emergency Department
Admission: EM | Admit: 2016-07-31 | Discharge: 2016-07-31 | Disposition: A | Discharge status: Home / Self Care | Payer: Self-pay | Attending: Emergency Medicine | Admitting: Emergency Medicine

## 2016-07-31 ENCOUNTER — Encounter: Payer: Self-pay | Admitting: Emergency Medicine

## 2016-07-31 DIAGNOSIS — Z79899 Other long term (current) drug therapy: Secondary | ICD-10-CM | POA: Insufficient documentation

## 2016-07-31 DIAGNOSIS — J039 Acute tonsillitis, unspecified: Secondary | ICD-10-CM | POA: Insufficient documentation

## 2016-07-31 DIAGNOSIS — F17210 Nicotine dependence, cigarettes, uncomplicated: Secondary | ICD-10-CM | POA: Insufficient documentation

## 2016-07-31 DIAGNOSIS — Z791 Long term (current) use of non-steroidal anti-inflammatories (NSAID): Secondary | ICD-10-CM | POA: Insufficient documentation

## 2016-07-31 LAB — POCT RAPID STREP A: Streptococcus, Group A Screen (Direct): NEGATIVE

## 2016-07-31 MED ORDER — AMOXICILLIN 500 MG PO CAPS: 500.0000 mg | ORAL_CAPSULE | Freq: Three times a day (TID) | ORAL | 0 refills | Status: DC

## 2016-07-31 MED ORDER — MAGIC MOUTHWASH
10.0000 mL | Freq: Once | ORAL | Status: AC
  Administered 2016-07-31: 10 mL via ORAL
  Filled 2016-07-31: qty 10

## 2016-07-31 MED ORDER — HYDROCODONE-ACETAMINOPHEN 7.5-325 MG/15ML PO SOLN
10.0000 mL | Freq: Once | ORAL | Status: AC
  Administered 2016-07-31: 10 mL via ORAL
  Filled 2016-07-31: qty 15

## 2016-07-31 MED ORDER — MAGIC MOUTHWASH: 5.0000 mL | Freq: Three times a day (TID) | ORAL | 0 refills | Status: AC | PRN

## 2016-07-31 MED ORDER — AMOXICILLIN 500 MG PO CAPS
500.0000 mg | ORAL_CAPSULE | Freq: Once | ORAL | Status: AC
  Administered 2016-07-31: 500 mg via ORAL
  Filled 2016-07-31: qty 1

## 2016-07-31 MED ORDER — DEXAMETHASONE SODIUM PHOSPHATE 10 MG/ML IJ SOLN
10.0000 mg | Freq: Once | INTRAMUSCULAR | Status: AC
  Administered 2016-07-31: 10 mg via INTRAMUSCULAR
  Filled 2016-07-31: qty 1

## 2016-07-31 NOTE — Discharge Instructions (Signed)
1. Take antibiotic as prescribed (amoxicillin 500 mg 3 times daily 7 days). 2. You may use Magic mouthwash as needed for throat discomfort. 3. Return to the ER for worsening symptoms, persistent vomiting, difficulty breathing or other concerns.

## 2016-07-31 NOTE — ED Notes (Signed)
Pt. Calling father to pick up.

## 2016-07-31 NOTE — ED Provider Notes (Signed)
[facility] Emergency Department Provider Note   ____________________________________________   None    (approximate)  I have reviewed the triage vital signs and the nursing notes.   HISTORY  Chief Complaint Sore Throat    HPI Marcus Park is a 35 y.o. male who presents to the ED from home with a chief complaint of sore throat. Patient reports a 2 day history of sore throat, first beginning on the left side than the right. Denies associated fever, chills, cough, chest pain, shortness of breath, nausea, vomiting, diarrhea. Able to tolerate secretions. Denies recent travel or trauma. Nothing makes his symptoms better or worse. Has not tried OTC medications for his pain.   Past medical history None  There are no active problems to display for this patient.   History reviewed. No pertinent surgical history.  Prior to Admission medications   Medication Sig Start Date End Date Taking? Authorizing Provider  amoxicillin (AMOXIL) 500 MG capsule Take 1 capsule (500 mg total) by mouth 3 (three) times daily. 07/31/16   Irean HongJade J Aliyanna Wassmer, MD  ibuprofen (ADVIL,MOTRIN) 800 MG tablet Take 1 tablet (800 mg total) by mouth 3 (three) times daily. 05/02/15   Tommi Rumpshonda L Summers, PA-C  magic mouthwash SOLN Take 5 mLs by mouth 3 (three) times daily as needed for mouth pain. 07/31/16   Irean HongJade J Bryce Cheever, MD  oxyCODONE-acetaminophen (PERCOCET) 5-325 MG per tablet Take 1 tablet by mouth every 4 (four) hours as needed for severe pain. 05/02/15   Tommi Rumpshonda L Summers, PA-C    Allergies Review of patient's allergies indicates no known allergies.  No family history on file.  Social History Social History  Substance Use Topics  . Smoking status: Current Every Day Smoker    Packs/day: 1.00    Types: Cigarettes  . Smokeless tobacco: Never Used  . Alcohol use No    Review of Systems  Constitutional: No fever/chills.  Eyes: No visual changes. ENT: Positive for sore  throat. Cardiovascular: Denies chest pain. Respiratory: Denies shortness of breath. Gastrointestinal: No abdominal pain.  No nausea, no vomiting.  No diarrhea.  No constipation. Genitourinary: Negative for dysuria. Musculoskeletal: Negative for back pain. Skin: Negative for rash. Neurological: Negative for headaches, focal weakness or numbness.  10-point ROS otherwise negative.  ____________________________________________   PHYSICAL EXAM:  VITAL SIGNS: ED Triage Vitals  Enc Vitals Group     BP 07/31/16 0301 128/77     Pulse Rate 07/31/16 0301 96     Resp 07/31/16 0301 20     Temp 07/31/16 0301 97.5 F (36.4 C)     Temp Source 07/31/16 0301 Oral     SpO2 07/31/16 0301 99 %     Weight 07/31/16 0258 215 lb (97.5 kg)     Height 07/31/16 0258 6\' 2"  (1.88 m)     Head Circumference --      Peak Flow --      Pain Score 07/31/16 0258 7     Pain Loc --      Pain Edu? --      Excl. in GC? --     Constitutional: Alert and oriented. Well appearing and in no acute distress. Eyes: Conjunctivae are normal. PERRL. EOMI. Head: Atraumatic. Nose: No congestion/rhinnorhea. Mouth/Throat: Mucous membranes are moist.  Oropharynx erythematous. Mild to moderate bilateral tonsillar swelling with exudate. There is no peritonsillar abscess. Voice is mildly hoarse. There is no muffled voice. There is no drooling. Neck: No stridor.  Supple neck without meningismus.  Hematological/Lymphatic/Immunilogical: Shotty anterior cervical lymphadenopathy. Cardiovascular: Normal rate, regular rhythm. Grossly normal heart sounds.  Good peripheral circulation. Respiratory: Normal respiratory effort.  No retractions. Lungs CTAB. Gastrointestinal: Soft and nontender. No distention. No abdominal bruits. No CVA tenderness. Musculoskeletal: No lower extremity tenderness nor edema.  No joint effusions. Neurologic:  Normal speech and language. No gross focal neurologic deficits are appreciated. No gait  instability. Skin:  Skin is warm, dry and intact. No rash noted. No petechiae. Psychiatric: Mood and affect are normal. Speech and behavior are normal.  ____________________________________________   LABS (all labs ordered are listed, but only abnormal results are displayed)  Labs Reviewed  POCT RAPID STREP A   ____________________________________________  EKG  None ____________________________________________  RADIOLOGY  None ____________________________________________   PROCEDURES  Procedure(s) performed: None  Procedures  Critical Care performed: No  ____________________________________________   INITIAL IMPRESSION / ASSESSMENT AND PLAN / ED COURSE  Pertinent labs & imaging results that were available during my care of the patient were reviewed by me and considered in my medical decision making (see chart for details).  35 year old male who presents with tonsillitis; rapid strep is negative. Will treat with IM Decadron, amoxicillin, Magic mouthwash and patient will follow-up with ENT as needed. Strict return precautions given. Patient verbalizes understanding and agrees with plan of care.  Clinical Course     ____________________________________________   FINAL CLINICAL IMPRESSION(S) / ED DIAGNOSES  Final diagnoses:  Tonsillitis      NEW MEDICATIONS STARTED DURING THIS VISIT:  New Prescriptions   AMOXICILLIN (AMOXIL) 500 MG CAPSULE    Take 1 capsule (500 mg total) by mouth 3 (three) times daily.   MAGIC MOUTHWASH SOLN    Take 5 mLs by mouth 3 (three) times daily as needed for mouth pain.     Note:  This document was prepared using Dragon voice recognition software and may include unintentional dictation errors.    Irean Hong, MD 07/31/16 [phone]

## 2016-07-31 NOTE — ED Triage Notes (Signed)
Patient ambulatory to triage with steady gait, without difficulty or distress noted; pt reports sore throat tonight with painful swallowing; denies any recent illness

## 2016-07-31 NOTE — ED Notes (Signed)
Pt. States sore throat that started Wednesday night.  Pt. States lt. Side hurt first, pt. States tonight rt. Side of throat hurts.  Pt. Able to hold down secretions.  Pt. Denies taking any OTC medications.

## 2016-11-16 ENCOUNTER — Emergency Department
Admission: EM | Admit: 2016-11-16 | Discharge: 2016-11-16 | Disposition: A | Discharge status: Home / Self Care | Payer: Self-pay | Attending: Emergency Medicine | Admitting: Emergency Medicine

## 2016-11-16 DIAGNOSIS — T401X1A Poisoning by heroin, accidental (unintentional), initial encounter: Secondary | ICD-10-CM | POA: Insufficient documentation

## 2016-11-16 DIAGNOSIS — F17210 Nicotine dependence, cigarettes, uncomplicated: Secondary | ICD-10-CM | POA: Insufficient documentation

## 2016-11-16 MED ORDER — ONDANSETRON HCL 4 MG/2ML IJ SOLN
4.0000 mg | INTRAMUSCULAR | Status: AC
  Administered 2016-11-16: 4 mg via INTRAVENOUS

## 2016-11-16 MED ORDER — ONDANSETRON HCL 4 MG/2ML IJ SOLN
INTRAMUSCULAR | Status: AC
  Administered 2016-11-16: 4 mg via INTRAVENOUS
  Filled 2016-11-16: qty 2

## 2016-11-16 MED ORDER — NALOXONE HCL 4 MG/0.1ML NA LIQD
NASAL | 0 refills | Status: AC
Start: 2016-11-16 — End: ?

## 2016-11-16 NOTE — ED Notes (Signed)
Pt ambulated with a steady gait in room with supervision of nurse and MD. Pt alert and oriented.

## 2016-11-16 NOTE — ED Provider Notes (Signed)
[facility] Emergency Department Provider Note  ____________________________________________   I have reviewed the triage vital signs and the nursing notes.   HISTORY  Chief Complaint Drug Overdose   History limited by: Not Limited   HPI MAXSON ODDO is a 36 y.o. male who presents to the emergency department today via EMS after heroin overdose. The patient stated that he snorted heroin today. Normally takes pain medication for chronic ankle pain. Denies frequent heroin use. Denies any other drug use today. Denies any thoughts of self harm. Patient received a total of 4 mg of narcan via EMS.    History reviewed. No pertinent past medical history.  There are no active problems to display for this patient.   History reviewed. No pertinent surgical history.  Prior to Admission medications   Medication Sig Start Date End Date Taking? Authorizing Provider  amoxicillin (AMOXIL) 500 MG capsule Take 1 capsule (500 mg total) by mouth 3 (three) times daily. 07/31/16   Irean Hong, MD  ibuprofen (ADVIL,MOTRIN) 800 MG tablet Take 1 tablet (800 mg total) by mouth 3 (three) times daily. 05/02/15   Tommi Rumps, PA-C  magic mouthwash SOLN Take 5 mLs by mouth 3 (three) times daily as needed for mouth pain. 07/31/16   Irean Hong, MD  oxyCODONE-acetaminophen (PERCOCET) 5-325 MG per tablet Take 1 tablet by mouth every 4 (four) hours as needed for severe pain. 05/02/15   Tommi Rumps, PA-C    Allergies Patient has no known allergies.  Family History  Problem Relation Age of Onset  . Family history unknown: Yes    Social History Social History  Substance Use Topics  . Smoking status: Current Every Day Smoker    Packs/day: 1.00    Types: Cigarettes  . Smokeless tobacco: Never Used  . Alcohol use No    Review of Systems  Constitutional: Negative for fever. Cardiovascular: Negative for chest pain. Respiratory: Negative for shortness of  breath. Gastrointestinal: Positive for nausea.  Genitourinary: Negative for dysuria. Musculoskeletal: Negative for back pain. Skin: Negative for rash. Neurological: Negative for headaches, focal weakness or numbness.  10-point ROS otherwise negative.  ____________________________________________   PHYSICAL EXAM:  VITAL SIGNS: ED Triage Vitals  Enc Vitals Group     BP 11/16/16 1653 115/83     Pulse Rate 11/16/16 1653 (!) 101     Resp 11/16/16 1653 20     Temp 11/16/16 1653 98.6 F (37 C)     Temp Source 11/16/16 1653 Oral     SpO2 11/16/16 1653 98 %     Weight 11/16/16 1654 212 lb (96.2 kg)     Height 11/16/16 1654 6\' 1"  (1.854 m)   Constitutional: Alert and oriented. Well appearing and in no distress. Eyes: Conjunctivae are normal. Normal extraocular movements. ENT   Head: Normocephalic and atraumatic.   Nose: No congestion/rhinnorhea.   Mouth/Throat: Mucous membranes are moist.   Neck: No stridor. Hematological/Lymphatic/Immunilogical: No cervical lymphadenopathy. Cardiovascular: Normal rate, regular rhythm.  No murmurs, rubs, or gallops.  Respiratory: Normal respiratory effort without tachypnea nor retractions. Breath sounds are clear and equal bilaterally. No wheezes/rales/rhonchi. Gastrointestinal: Soft and non tender. No rebound. No guarding.  Genitourinary: Deferred Musculoskeletal: Normal range of motion in all extremities. No lower extremity edema. Neurologic:  Normal speech and language. No gross focal neurologic deficits are appreciated.  Skin:  Skin is warm, dry and intact. No rash noted. Psychiatric: Mood and affect are normal. Speech and behavior are normal. Patient  exhibits appropriate insight and judgment.  ____________________________________________    LABS (pertinent positives/negatives)  Labs Reviewed - No data to display   ____________________________________________   EKG  None  ____________________________________________     RADIOLOGY  None  ____________________________________________   PROCEDURES  Procedures  ____________________________________________   INITIAL IMPRESSION / ASSESSMENT AND PLAN / ED COURSE  Pertinent labs & imaging results that were available during my care of the patient were reviewed by me and considered in my medical decision making (see chart for details).  Patient presents to the emergency department today after heroin overdose and after receiving Narcan by first responders. On my exam patient is awake and alert no acute distress. Will plan observing in the emergency department for a couple of hours.   Patient was observed for two hours without complication. Repeat auscultation still with clear lungs. Patient able to ambulate easily.    ____________________________________________   FINAL CLINICAL IMPRESSION(S) / ED DIAGNOSES  Final diagnoses:  Accidental overdose of heroin, initial encounter     Note: This dictation was prepared with Dragon dictation. Any transcriptional errors that result from this process are unintentional     Phineas SemenGraydon Kolden Dupee, MD 11/16/16 1932

## 2016-11-16 NOTE — Discharge Instructions (Signed)
Please seek medical attention for any high fevers, chest pain, shortness of breath, change in behavior, persistent vomiting, bloody stool or any other new or concerning symptoms.  

## 2016-11-16 NOTE — ED Triage Notes (Signed)
Pt to ED from home via [facility] c/o drug overdose. Per EMS pt over dosed on heroin, and required 4mg  of narcan (3mg  intranasal and mg  IV dose administered). Pt alert and oriented at this time, nauseous and vomiting, but in no acute distress at this time.

## 2018-01-30 ENCOUNTER — Encounter: Payer: Self-pay | Admitting: Emergency Medicine

## 2018-01-30 ENCOUNTER — Emergency Department
Admission: EM | Admit: 2018-01-30 | Discharge: 2018-01-30 | Disposition: A | Discharge status: Home / Self Care | Payer: Self-pay | Attending: Emergency Medicine | Admitting: Emergency Medicine

## 2018-01-30 DIAGNOSIS — K0889 Other specified disorders of teeth and supporting structures: Secondary | ICD-10-CM | POA: Insufficient documentation

## 2018-01-30 DIAGNOSIS — F17210 Nicotine dependence, cigarettes, uncomplicated: Secondary | ICD-10-CM | POA: Insufficient documentation

## 2018-01-30 DIAGNOSIS — Z79899 Other long term (current) drug therapy: Secondary | ICD-10-CM | POA: Insufficient documentation

## 2018-01-30 MED ORDER — AMOXICILLIN 500 MG PO CAPS: 500.0000 mg | ORAL_CAPSULE | Freq: Three times a day (TID) | ORAL | 0 refills | Status: AC

## 2018-01-30 MED ORDER — KETOROLAC TROMETHAMINE 30 MG/ML IJ SOLN
30.0000 mg | Freq: Once | INTRAMUSCULAR | Status: AC
  Administered 2018-01-30: 30 mg via INTRAMUSCULAR
  Filled 2018-01-30: qty 1

## 2018-01-30 MED ORDER — KETOROLAC TROMETHAMINE 10 MG PO TABS: 10.0000 mg | ORAL_TABLET | Freq: Four times a day (QID) | ORAL | 0 refills | Status: AC | PRN

## 2018-01-30 NOTE — Discharge Instructions (Signed)
OPTIONS FOR DENTAL FOLLOW UP CARE ° °[place] Department of Health and Human Services - Local Safety Net Dental Clinics °http://www.ncdhhs.gov/dph/oralhealth/services/safetynetclinics.htm °  °[facility] (336-562-3123) ° °[facility] [place] (919-933-9087) ° °[facility] ([phone] ext [phone]) ° °[facility] ([phone]) ° °[facility] ([phone]) °This clinic caters to the indigent population and is on a lottery system. °Location: °[facility], [address], [place] °Clinic Hours: °Wednesdays from 6pm - 9pm, patients seen by a lottery system. °For dates, call or go to [email or web address]/[facility]/[email or web address] °Services: °Cleanings, fillings and simple extractions. °Payment Options: °DENTAL WORK IS FREE OF CHARGE. Bring proof of income or support. °Best way to get seen: °Arrive at 5:15 pm - this is a lottery, NOT first come/first serve, so arriving earlier will not increase your chances of being seen. °  °  °[facility] °[phone] °Select option 1 for emergencies °  °Location: °[facility], [address], [place] °Clinic Hours: °No walk-ins accepted - call the day before to schedule an appointment. °Check in times are 9:30 am and 1:30 pm. °Services: °Simple extractions, temporary fillings, pulpectomy/pulp debridement, uncomplicated abscess drainage. °Payment Options: °PAYMENT IS DUE AT THE TIME OF SERVICE.  Fee is usually $100-200, additional surgical procedures (e.g. abscess drainage) may be extra. °Cash, checks, Visa/MasterCard accepted.  Can file Medicaid if patient is covered for dental - patient should call case worker to check. °No discount for [facility] patients. °Best way to get seen: °MUST call the day before and get onto the schedule. Can usually be seen the next 1-2 days. No walk-ins accepted. °  °  °[facility] °[phone] °   °Location: °[facility], [address], [place] °Clinic Hours: °M, W, Th, F 8am or 1:30pm, Tues 9a or 1:30 - first come/first served. °Services: °Simple extractions, temporary fillings, uncomplicated abscess drainage.  You do not need to be an [place] resident. °Payment Options: °PAYMENT IS DUE AT THE TIME OF SERVICE. °Dental insurance, otherwise sliding scale - bring proof of income or support. °Depending on income and treatment needed, cost is usually $50-200. °Best way to get seen: °Arrive early as it is first come/first served. °  °  °[facility] °[phone] °  °Location: °[address] °Clinic Hours: °Mon-Thu 8a-5p °Services: °Most basic dental services including extractions and fillings. °Payment Options: °PAYMENT IS DUE AT THE TIME OF SERVICE. °Sliding scale, up to 50% off - bring proof if income or support. °Medicaid with dental option accepted. °Best way to get seen: °Call to schedule an appointment, can usually be seen within 2 weeks OR they will try to see walk-ins - show up at 8a or 2p (you may have to wait). °  °  °[facility] °[phone] °[place] RESIDENTS ONLY °  °Location: °[facility], [address], [place], [place] [address] °Clinic Hours: By appointment only. °Monday - Thursday 8am-5pm, Friday 8am-12pm °Services: Cleanings, fillings, extractions. °Payment Options: °PAYMENT IS DUE AT THE TIME OF SERVICE. °Cash, Visa or MasterCard. Sliding scale - $30 minimum per service. °Best way to get seen: °Come in to office, complete packet and make an appointment - need proof of income °or support monies for each household member and proof of [place] residence. °Usually takes about a month to get in. °  °  °[facility] °[phone] °  °Location: °[address]   [place] °Clinic Hours: Walk-in Urgent Care Dental Services are offered Monday-Friday  mornings only. °The numbers of emergencies accepted daily is limited to the number of °providers available. °Maximum 15 - Mondays, Wednesdays & Thursdays °Maximum 10 - Tuesdays & Fridays °Services: °You do not need to be a [place] resident to be seen for a dental emergency. °Emergencies are defined as pain, swelling, abnormal bleeding, or dental trauma. Walkins will receive x-rays if needed. °NOTE: Dental cleaning is not an emergency. °Payment Options: °PAYMENT IS DUE AT THE TIME OF SERVICE. °Minimum co-pay is $40.00 for uninsured patients. °Minimum co-pay is $3.00 for Medicaid with dental coverage. °Dental Insurance is accepted and must be presented at time of visit. °Medicare does not cover dental. °Forms of payment: Cash, credit card, checks. °Best way to get seen: °If not previously registered with the clinic, walk-in dental registration begins at 7:15 am and is on a first come/first serve basis. °If previously registered with the clinic, call to make an appointment. °  °  °[facility] °[phone] °[place] RESIDENTS ONLY °  °Location: °[address], [place], [place] °Clinic Hours: °Mon-Thu 10a-2p °Services: Extractions only! °Payment Options: °FREE (donations accepted) - bring proof of income or support °Best way to get seen: °Call and schedule an appointment OR come at 8am on the 1st Monday of every month (except for holidays) when it is first come/first served. °  °  °[facility] °[phone] °  °Location: °[address], [place] °Clinic Hours: °Friday mornings °Services, Payment Options, Best way to get seen: °Call for info °

## 2018-01-30 NOTE — ED Triage Notes (Signed)
Patient is complaining of left upper dental pain x 5 days.  Patient states he has a known abscess to tooth but suddenly is having pain.

## 2018-01-30 NOTE — ED Provider Notes (Signed)
[facility] Emergency Department Provider Note  ____________________________________________  Time seen: Approximately 10:20 PM  I have reviewed the triage vital signs and the nursing notes.   HISTORY  Chief Complaint Dental Pain    HPI Marcus Park is a 37 y.o. male presents to the emergency department with superior 27 pain.  Patient has noticed gingival hypertrophy along aforementioned tooth and mild left upper jaw swelling.  Patient reports that he has an appointment with a local dentist and medicine on Friday.  Patient has noticed no fever or chills.  Patient reports that he is here for pain management.  Patient reports that the pain wakes him up at night and he is unable to get back to sleep.  No alleviating measures of been attempted.   History reviewed. No pertinent past medical history.  There are no active problems to display for this patient.   History reviewed. No pertinent surgical history.  Prior to Admission medications   Medication Sig Start Date End Date Taking? Authorizing Provider  amoxicillin (AMOXIL) 500 MG capsule Take 1 capsule (500 mg total) by mouth 3 (three) times daily for 10 days. 01/30/18 02/09/18  Orvil Feil, PA-C  ibuprofen (ADVIL,MOTRIN) 800 MG tablet Take 1 tablet (800 mg total) by mouth 3 (three) times daily. 05/02/15   Tommi Rumps, PA-C  ketorolac (TORADOL) 10 MG tablet Take 1 tablet (10 mg total) by mouth every 6 (six) hours as needed for up to 5 days. 01/30/18 02/04/18  Orvil Feil, PA-C  magic mouthwash SOLN Take 5 mLs by mouth 3 (three) times daily as needed for mouth pain. 07/31/16   Irean Hong, MD  naloxone [facility]) nasal spray 4 mg/0.1 mL To use in the event of heroin overdose evidenced by decreased breathing, lack of breathing, unresponsiveness. 11/16/16   Phineas Semen, MD  oxyCODONE-acetaminophen (PERCOCET) 5-325 MG per tablet Take 1 tablet by mouth every 4 (four) hours as needed for severe pain. 05/02/15    Tommi Rumps, PA-C    Allergies Patient has no known allergies.  Family History  Family history unknown: Yes    Social History Social History   Tobacco Use  . Smoking status: Current Every Day Smoker    Packs/day: 1.00    Types: Cigarettes  . Smokeless tobacco: Never Used  Substance Use Topics  . Alcohol use: No  . Drug use: No     Review of Systems  Constitutional: No fever/chills Eyes: No visual changes. No discharge ENT: Patient has Superior 27 pain.  Cardiovascular: no chest pain. Respiratory: no cough. No SOB. Gastrointestinal: No abdominal pain.  No nausea, no vomiting.  No diarrhea.  No constipation. Musculoskeletal: Negative for musculoskeletal pain. Skin: Negative for rash, abrasions, lacerations, ecchymosis. Neurological: Negative for headaches, focal weakness or numbness.  ____________________________________________   PHYSICAL EXAM:  VITAL SIGNS: ED Triage Vitals  Enc Vitals Group     BP 01/30/18 1741 133/79     Pulse Rate 01/30/18 1741 (!) 103     Resp 01/30/18 1741 18     Temp 01/30/18 1741 98.8 F (37.1 C)     Temp Source 01/30/18 1741 Oral     SpO2 01/30/18 1741 97 %     Weight 01/30/18 1742 209 lb (94.8 kg)     Height 01/30/18 1742 6\' 2"  (1.88 m)     Head Circumference --      Peak Flow --      Pain Score 01/30/18 1742 6  Pain Loc --      Pain Edu? --      Excl. in GC? --      Constitutional: Alert and oriented. Well appearing and in no acute distress. Eyes: Conjunctivae are normal. PERRL. EOMI. Head: Atraumatic. ENT:      Ears: TMs are pearly.       Nose: No congestion/rhinnorhea.      Mouth/Throat: Mucous membranes are moist.  Patient has gingival hypertrophy along superior 27 with dental caries visualized. Hematological/Lymphatic/Immunilogical: No cervical lymphadenopathy. Cardiovascular: Normal rate, regular rhythm. Normal S1 and S2.  Good peripheral circulation. Respiratory: Normal respiratory effort without  tachypnea or retractions. Lungs CTAB. Good air entry to the bases with no decreased or absent breath sounds.  Skin:  Skin is warm, dry and intact. No rash noted.  ____________________________________________   LABS (all labs ordered are listed, but only abnormal results are displayed)  Labs Reviewed - No data to display ____________________________________________  EKG   ____________________________________________  RADIOLOGY  No results found.  ____________________________________________    PROCEDURES  Procedure(s) performed:    Procedures    Medications  ketorolac (TORADOL) 30 MG/ML injection 30 mg (30 mg Intramuscular Given 01/30/18 2028)     ____________________________________________   INITIAL IMPRESSION / ASSESSMENT AND PLAN / ED COURSE  Pertinent labs & imaging results that were available during my care of the patient were reviewed by me and considered in my medical decision making (see chart for details).  Review of the [place] CSRS was performed in accordance of the [facility] prior to dispensing any controlled drugs.    Assessment and plan Dental pain Patient presents to the emergency department with superior 27 pain, gingival hypertrophy and mild edema of the left upper jaw.  Patient was discharged with amoxicillin.  He was given an injection of Toradol in the emergency department after he denied a history of GI bleeds.  He was discharged with Toradol.  Vital signs are reassuring prior to discharge.  Patient was advised to keep appointment with local dentist.  All patient questions were answered.    ____________________________________________  FINAL CLINICAL IMPRESSION(S) / ED DIAGNOSES  Final diagnoses:  Pain, dental      NEW MEDICATIONS STARTED DURING THIS VISIT:  ED Discharge Orders        Ordered    amoxicillin (AMOXIL) 500 MG capsule  3 times daily     01/30/18 2017    ketorolac (TORADOL) 10 MG tablet  Every 6 hours PRN     01/30/18 2017           This chart was dictated using voice recognition software/Dragon. Despite best efforts to proofread, errors can occur which can change the meaning. Any change was purely unintentional.    Gasper LloydWoods, Cote Mayabb M, PA-C 01/30/18 2225    Sharman CheekStafford, Phillip, MD 01/30/18 [phone]

## 2020-03-02 ENCOUNTER — Encounter: Payer: Self-pay | Admitting: Emergency Medicine

## 2020-03-02 ENCOUNTER — Emergency Department
Admission: EM | Admit: 2020-03-02 | Discharge: 2020-03-02 | Disposition: A | Discharge status: Home / Self Care | Payer: Self-pay | Attending: Emergency Medicine | Admitting: Emergency Medicine

## 2020-03-02 ENCOUNTER — Emergency Department: Payer: Self-pay

## 2020-03-02 ENCOUNTER — Other Ambulatory Visit: Payer: Self-pay

## 2020-03-02 DIAGNOSIS — F17210 Nicotine dependence, cigarettes, uncomplicated: Secondary | ICD-10-CM | POA: Insufficient documentation

## 2020-03-02 DIAGNOSIS — M79605 Pain in left leg: Secondary | ICD-10-CM | POA: Insufficient documentation

## 2020-03-02 DIAGNOSIS — R609 Edema, unspecified: Secondary | ICD-10-CM

## 2020-03-02 DIAGNOSIS — R2243 Localized swelling, mass and lump, lower limb, bilateral: Secondary | ICD-10-CM | POA: Insufficient documentation

## 2020-03-02 DIAGNOSIS — M79604 Pain in right leg: Secondary | ICD-10-CM | POA: Insufficient documentation

## 2020-03-02 LAB — COMPREHENSIVE METABOLIC PANEL
ALT: 17 U/L (ref 0–44)
AST: 22 U/L (ref 15–41)
Albumin: 3.7 g/dL (ref 3.5–5.0)
Alkaline Phosphatase: 54 U/L (ref 38–126)
Anion gap: 11 (ref 5–15)
BUN: 17 mg/dL (ref 6–20)
CO2: 25 mmol/L (ref 22–32)
Calcium: 8.9 mg/dL (ref 8.9–10.3)
Chloride: 100 mmol/L (ref 98–111)
Creatinine, Ser: 1.04 mg/dL (ref 0.61–1.24)
GFR calc Af Amer: >60 mL/min (ref >60)
GFR calc non Af Amer: >60 mL/min (ref >60)
Glucose, Bld: 141 mg/dL — ABNORMAL HIGH (ref 70–99)
Potassium: 3.7 mmol/L (ref 3.5–5.1)
Sodium: 136 mmol/L (ref 135–145)
Total Bilirubin: 1.1 mg/dL (ref 0.3–1.2)
Total Protein: 7.8 g/dL (ref 6.5–8.1)

## 2020-03-02 LAB — CBC WITH DIFFERENTIAL/PLATELET
Abs Immature Granulocytes: 0.05 10*3/uL (ref 0.00–0.07)
Basophils Absolute: 0.1 10*3/uL (ref 0.0–0.1)
Basophils Relative: 0 %
Eosinophils Absolute: 0.3 10*3/uL (ref 0.0–0.5)
Eosinophils Relative: 2 %
HCT: 40.4 % (ref 39.0–52.0)
Hemoglobin: 13.6 g/dL (ref 13.0–17.0)
Immature Granulocytes: 0 %
Lymphocytes Relative: 12 %
Lymphs Abs: 1.8 10*3/uL (ref 0.7–4.0)
MCH: 29.9 pg (ref 26.0–34.0)
MCHC: 33.7 g/dL (ref 30.0–36.0)
MCV: 88.8 fL (ref 80.0–100.0)
Monocytes Absolute: 1.1 10*3/uL — ABNORMAL HIGH (ref 0.1–1.0)
Monocytes Relative: 7 %
Neutro Abs: 11.2 10*3/uL — ABNORMAL HIGH (ref 1.7–7.7)
Neutrophils Relative %: 79 %
Platelets: 486 10*3/uL — ABNORMAL HIGH (ref 150–400)
RBC: 4.55 MIL/uL (ref 4.22–5.81)
RDW: 12.7 % (ref 11.5–15.5)
WBC: 14.5 10*3/uL — ABNORMAL HIGH (ref 4.0–10.5)
nRBC: 0 % (ref 0.0–0.2)

## 2020-03-02 LAB — BRAIN NATRIURETIC PEPTIDE: B Natriuretic Peptide: 24.4 pg/mL (ref 0.0–100.0)

## 2020-03-02 LAB — LACTIC ACID, PLASMA: Lactic Acid, Venous: 0.6 mmol/L (ref 0.5–1.9)

## 2020-03-02 LAB — TROPONIN I (HIGH SENSITIVITY): Troponin I (High Sensitivity): 6 ng/L (ref <18)

## 2020-03-02 MED ORDER — ONDANSETRON HCL 4 MG/2ML IJ SOLN
4.0000 mg | Freq: Once | INTRAMUSCULAR | Status: AC
  Administered 2020-03-02: 4 mg via INTRAVENOUS
  Filled 2020-03-02: qty 2

## 2020-03-02 MED ORDER — FUROSEMIDE 20 MG PO TABS: 20.0000 mg | ORAL_TABLET | Freq: Every day | ORAL | 0 refills | Status: AC

## 2020-03-02 MED ORDER — CEPHALEXIN 500 MG PO CAPS: 500.0000 mg | ORAL_CAPSULE | Freq: Three times a day (TID) | ORAL | 0 refills | Status: DC

## 2020-03-02 MED ORDER — VANCOMYCIN HCL IN DEXTROSE 1-5 GM/200ML-% IV SOLN
1000.0000 mg | Freq: Once | INTRAVENOUS | Status: AC
Start: 2020-03-02 — End: 2020-03-02
  Administered 2020-03-02: 1000 mg via INTRAVENOUS
  Filled 2020-03-02: qty 200

## 2020-03-02 MED ORDER — MORPHINE SULFATE (PF) 4 MG/ML IV SOLN
4.0000 mg | Freq: Once | INTRAVENOUS | Status: AC
  Administered 2020-03-02: 4 mg via INTRAVENOUS
  Filled 2020-03-02: qty 1

## 2020-03-02 NOTE — ED Triage Notes (Signed)
Pt arrived via POV with reports of 3 days of leg swelling, pt states he has not been able to put on his boots for work, bilateral LE pitting edema present as well as redness noted.  Pt c/o pain with palpation to lower extremities.  Pt has no PMH.

## 2020-03-02 NOTE — ED Provider Notes (Signed)
[facility] Emergency Department Provider Note  Time seen: 1:30 PM  I have reviewed the triage vital signs and the nursing notes.   HISTORY  Chief Complaint Leg Swelling   HPI Marcus Park is a 39 y.o. male with a past medical history of substance abuse presents to the emergency department for bilateral lower extremity swelling and discomfort.  According to the patient for the past for 5 days he has been experiencing lower extremity swelling and pain.  Patient denies any history of lower extremity swelling in the past.  Denies any chest pain or shortness of breath.  Denies any recent illnesses fever cough congestion.  Patient describes moderate lower extremity pain.  Does have erythema of the lower extremities as well.   History reviewed. No pertinent past medical history.  There are no problems to display for this patient.   History reviewed. No pertinent surgical history.  Prior to Admission medications   Medication Sig Start Date End Date Taking? Authorizing Provider  ibuprofen (ADVIL,MOTRIN) 800 MG tablet Take 1 tablet (800 mg total) by mouth 3 (three) times daily. 05/02/15   Tommi Rumps, PA-C  magic mouthwash SOLN Take 5 mLs by mouth 3 (three) times daily as needed for mouth pain. 07/31/16   Irean Hong, MD  naloxone [facility]) nasal spray 4 mg/0.1 mL To use in the event of heroin overdose evidenced by decreased breathing, lack of breathing, unresponsiveness. 11/16/16   Phineas Semen, MD  oxyCODONE-acetaminophen (PERCOCET) 5-325 MG per tablet Take 1 tablet by mouth every 4 (four) hours as needed for severe pain. 05/02/15   Tommi Rumps, PA-C    No Known Allergies  Family History  Family history unknown: Yes    Social History Social History   Tobacco Use  . Smoking status: Current Every Day Smoker    Packs/day: 1.00    Types: Cigarettes  . Smokeless tobacco: Never Used  Substance Use Topics  . Alcohol use: No  . Drug use: No     Review of Systems Constitutional: Negative for fever. Cardiovascular: Negative for chest pain. Respiratory: Negative for shortness of breath. Gastrointestinal: Negative for abdominal pain Musculoskeletal: Lower extreme edema bilaterally Skin: Redness of his legs Neurological: Negative for headache All other ROS negative  ____________________________________________   PHYSICAL EXAM:  VITAL SIGNS: ED Triage Vitals  Enc Vitals Group     BP 03/02/20 1152 131/70     Pulse Rate 03/02/20 1152 95     Resp 03/02/20 1152 16     Temp 03/02/20 1152 98.9 F (37.2 C)     Temp Source 03/02/20 1152 Oral     SpO2 03/02/20 1152 97 %     Weight 03/02/20 1152 219 lb (99.3 kg)     Height 03/02/20 1152 6\' 2"  (1.88 m)     Head Circumference --      Peak Flow --      Pain Score 03/02/20 1156 7     Pain Loc --      Pain Edu? --      Excl. in GC? --     Constitutional: Alert and oriented. Well appearing and in no distress. Eyes: Normal exam ENT      Head: Normocephalic and atraumatic.      Mouth/Throat: Mucous membranes are moist. Cardiovascular: Normal rate, regular rhythm. Respiratory: Normal respiratory effort without tachypnea nor retractions. Breath sounds are clear Gastrointestinal: Soft and nontender. No distention. Musculoskeletal: 2+ lower extreme edema, equal bilaterally.  2+ DP  pulses bilaterally.  Moderate erythema bilateral lower extremities with moderate tenderness of bilateral lower extremities. Neurologic:  Normal speech and language.  Skin:  Skin is warm.  Erythema lower extremities. Psychiatric: Mood and affect are normal.   ____________________________________________    EKG  EKG viewed and interpreted by myself shows a normal sinus rhythm at 99 bpm with a narrow QRS, normal axis, normal intervals, no concerning ST changes.  ____________________________________________    RADIOLOGY  Ultrasounds are negative. Chest x-ray  negative.  ____________________________________________   INITIAL IMPRESSION / ASSESSMENT AND PLAN / ED COURSE  Pertinent labs & imaging results that were available during my care of the patient were reviewed by me and considered in my medical decision making (see chart for details).   Patient presents to the emergency department with bilateral lower extremity edema new onset x4 days.  Patient does have erythema moderate tenderness possibly consistent with cellulitis in addition to the edema.  Denies shortness of breath however given the new onset edema we will obtain a chest x-ray as a precaution.  Patient does admit to substance use history but denies ever using IV drugs.  We will check blood cultures as a precaution.  I have added on cardiac enzymes and a BNP to the patient's lab work.  We will also obtain bilateral lower extremity ultrasounds.  Patient agreeable to plan of care.  Imaging studies are largely negative.  Have added on the troponin and BNP which have resulted normal.  Patient continues to appear overall well.  We will dose IV vancomycin in the emergency department.  Blood cultures have been sent.  We will discharge with Keflex and cardiology follow-up for an echocardiogram.  I discussed this as well as return precautions.  Patient agreeable to plan.  Marcus Park was evaluated in Emergency Department on 03/02/2020 for the symptoms described in the history of present illness. He was evaluated in the context of the global COVID-19 pandemic, which necessitated consideration that the patient might be at risk for infection with the SARS-CoV-2 virus that causes COVID-19. Institutional protocols and algorithms that pertain to the evaluation of patients at risk for COVID-19 are in a state of rapid change based on information released by regulatory bodies including the CDC and federal and state organizations. These policies and algorithms were followed during the patient's care in the  ED.  ____________________________________________   FINAL CLINICAL IMPRESSION(S) / ED DIAGNOSES  New onset lower extremity edema Cellulitis   Harvest Dark, MD 03/02/20 [phone]

## 2020-03-02 NOTE — Discharge Instructions (Signed)
Please take your antibiotics as prescribed for their entire course.  Please take your fluid pill for the next 5 days.  Please call the number provided for cardiology to arrange a follow-up appointment as soon as possible.  Return to the emergency department for any worsening swelling, development of fever, chest pain, shortness of breath or any other symptom personally concerning to yourself.

## 2020-03-02 NOTE — ED Notes (Signed)
First Nurse Note: Pt to ED via POV c/o Bilateral feet swelling for the past 3 days. Pt is in NAD.

## 2020-03-07 LAB — CULTURE, BLOOD (ROUTINE X 2)
Culture: NO GROWTH
Culture: NO GROWTH
Special Requests: ADEQUATE
Special Requests: ADEQUATE

## 2020-04-18 ENCOUNTER — Encounter: Payer: Self-pay | Admitting: Emergency Medicine

## 2020-04-18 ENCOUNTER — Emergency Department
Admission: EM | Admit: 2020-04-18 | Discharge: 2020-04-18 | Disposition: A | Discharge status: Home / Self Care | Payer: Self-pay | Attending: Emergency Medicine | Admitting: Emergency Medicine

## 2020-04-18 ENCOUNTER — Other Ambulatory Visit: Payer: Self-pay

## 2020-04-18 DIAGNOSIS — F17210 Nicotine dependence, cigarettes, uncomplicated: Secondary | ICD-10-CM | POA: Insufficient documentation

## 2020-04-18 DIAGNOSIS — Z0289 Encounter for other administrative examinations: Secondary | ICD-10-CM

## 2020-04-18 DIAGNOSIS — Z0279 Encounter for issue of other medical certificate: Secondary | ICD-10-CM | POA: Insufficient documentation

## 2020-04-18 DIAGNOSIS — R5383 Other fatigue: Secondary | ICD-10-CM | POA: Insufficient documentation
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
## 2020-04-18 NOTE — ED Provider Notes (Addendum)
[facility] Emergency Department Provider Note ____________________________________________  Time seen: 1730  I have reviewed the triage vital signs and the nursing notes.  HISTORY  Chief Complaint  Letter for School/Work   HPI Marcus Park is a 39 y.o. male presents to the ER today requesting a work note.  He reports he called out of work today and left early yesterday because he has been so tired the last few days.  He feels like the fatigue may be being caused by him being overworked at and lack of air condition at his place of employment.  He reports he has been sweating excessively but drinking as much water as he can throughout his shift.  He denies headaches, dizziness, visual changes, chest pain, chest tightness, shortness of breath, nausea, vomiting, diarrhea.  He denies fever, chills or body aches.  He plans to return to work tomorrow.   History reviewed. No pertinent past medical history.  There are no problems to display for this patient.   History reviewed. No pertinent surgical history.  Prior to Admission medications   Medication Sig Start Date End Date Taking? Authorizing Provider  furosemide (LASIX) 20 MG tablet Take 1 tablet (20 mg total) by mouth daily. 03/02/20 03/02/21  Minna Antis, MD  magic mouthwash SOLN Take 5 mLs by mouth 3 (three) times daily as needed for mouth pain. 07/31/16   Irean Hong, MD  naloxone [facility]) nasal spray 4 mg/0.1 mL To use in the event of heroin overdose evidenced by decreased breathing, lack of breathing, unresponsiveness. 11/16/16   Phineas Semen, MD    Allergies Patient has no known allergies.  Family History  Family history unknown: Yes    Social History Social History   Tobacco Use  . Smoking status: Current Every Day Smoker    Packs/day: 1.00    Types: Cigarettes  . Smokeless tobacco: Never Used  Vaping Use  . Vaping Use: Never used  Substance Use Topics  . Alcohol use: No  . Drug  use: No    Review of Systems  Constitutional: Negative for fever, chills or body aches. Eyes: Negative for eye pain, eye redness, eye discharge or visual changes. ENT: Negative for runny nose, nasal congestion, ear pain, loss of taste/smell, or sore throat. Cardiovascular: Negative for chest pain or chest tightness. Respiratory: Negative for cough or shortness of breath. Gastrointestinal: Negative for abdominal pain, nausea, vomiting and diarrhea. Genitourinary: Negative for urinary urgency, frequency or dysuria. Musculoskeletal: Negative for joint pain or swelling. Skin: Negative for rash. Neurological: Negative for headaches, dizziness, focal weakness, tingling or numbness. ____________________________________________  PHYSICAL EXAM:  VITAL SIGNS: ED Triage Vitals  Enc Vitals Group     BP --      Pulse Rate 04/18/20 1514 77     Resp 04/18/20 1514 16     Temp 04/18/20 1514 (!) 97.4 F (36.3 C)     Temp Source 04/18/20 1514 Oral     SpO2 04/18/20 1514 100 %     Weight 04/18/20 1517 225 lb (102.1 kg)     Height 04/18/20 1517 6\' 2"  (1.88 m)     Head Circumference --      Peak Flow --      Pain Score 04/18/20 1516 5     Pain Loc --      Pain Edu? --      Excl. in GC? --     Constitutional: Alert and oriented.  Obese, in no distress. Head: Normocephalic and  atraumatic. Eyes: Conjunctivae are normal. PERRL. Normal extraocular movements Ears: Canals clear. TMs intact bilaterally. Nose: No congestion/rhinorrhea/epistaxis. Mouth/Throat: Mucous membranes are moist.  No posterior pharynx erythema or exudate noted. Hematological/Lymphatic/Immunological: No cervical lymphadenopathy. Cardiovascular: Normal rate, regular rhythm.  Respiratory: Normal respiratory effort. No wheezes/rales/rhonchi. Gastrointestinal: Soft and nontender. No distention. Musculoskeletal: No difficulty with gait. Neurologic: Normal speech and language. No gross focal neurologic deficits are appreciated.  Skin:  Skin is warm, dry and intact. No rash noted. ____________________________________________  INITIAL IMPRESSION / ASSESSMENT AND PLAN / ED COURSE  Fatigue, Encounter to Obtain Excuse From Work:  Has improved He is requesting work note today, would like to return tomorrow Exam benign- no indication for labs or imaging ____________________________________________  FINAL CLINICAL IMPRESSION(S) / ED DIAGNOSES  Final diagnoses:  Other fatigue  Encounter to obtain excuse from work      Lorre Munroe, NP 04/18/20 1738    Lorre Munroe, NP 04/18/20 Berna Spare    Emily Filbert, MD 04/18/20 2016

## 2020-04-18 NOTE — Discharge Instructions (Signed)
You were seen today for a work excuse.  I have given you a note excusing you from work today.  You may return to work tomorrow.

## 2020-08-01 ENCOUNTER — Encounter: Payer: Self-pay | Admitting: Intensive Care

## 2020-08-01 ENCOUNTER — Other Ambulatory Visit: Payer: Self-pay

## 2020-08-01 ENCOUNTER — Emergency Department
Admission: EM | Admit: 2020-08-01 | Discharge: 2020-08-01 | Disposition: A | Discharge status: Home / Self Care | Payer: Self-pay | Attending: Emergency Medicine | Admitting: Emergency Medicine

## 2020-08-01 ENCOUNTER — Emergency Department: Payer: Self-pay

## 2020-08-01 DIAGNOSIS — R0789 Other chest pain: Secondary | ICD-10-CM | POA: Diagnosis present

## 2020-08-01 DIAGNOSIS — F17210 Nicotine dependence, cigarettes, uncomplicated: Secondary | ICD-10-CM | POA: Insufficient documentation

## 2020-08-01 MED ORDER — CYCLOBENZAPRINE HCL 10 MG PO TABS: 10.0000 mg | ORAL_TABLET | Freq: Three times a day (TID) | ORAL | 0 refills | Status: AC | PRN

## 2020-08-01 MED ORDER — HYDROCODONE-ACETAMINOPHEN 5-325 MG PO TABS
1.0000 | ORAL_TABLET | Freq: Once | ORAL | Status: AC
  Administered 2020-08-01: 1 via ORAL
  Filled 2020-08-01: qty 1
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
## 2020-08-01 NOTE — ED Notes (Signed)
Patient transported to X-ray 

## 2021-05-05 ENCOUNTER — Other Ambulatory Visit: Payer: Self-pay

## 2021-05-05 ENCOUNTER — Emergency Department
Admission: EM | Admit: 2021-05-05 | Discharge: 2021-05-05 | Disposition: A | Discharge status: Home / Self Care | Payer: Self-pay | Attending: Emergency Medicine | Admitting: Emergency Medicine

## 2021-05-05 DIAGNOSIS — F17210 Nicotine dependence, cigarettes, uncomplicated: Secondary | ICD-10-CM | POA: Insufficient documentation

## 2021-05-05 DIAGNOSIS — T148XXA Other injury of unspecified body region, initial encounter: Secondary | ICD-10-CM

## 2021-05-05 DIAGNOSIS — X58XXXD Exposure to other specified factors, subsequent encounter: Secondary | ICD-10-CM | POA: Insufficient documentation

## 2021-05-05 DIAGNOSIS — S91002D Unspecified open wound, left ankle, subsequent encounter: Secondary | ICD-10-CM | POA: Insufficient documentation

## 2021-05-05 NOTE — ED Provider Notes (Signed)
[facility] Emergency Department Provider Note   ____________________________________________    I have reviewed the triage vital signs and the nursing notes.   HISTORY  Chief Complaint Ankle Problem     HPI Marcus Park is a 40 y.o. male for evaluation of an area on the ankle which he reports was bleeding yesterday.  Patient reports he was bathing and was rubbing his ankle and the symptoms started bleeding, he applied pressure and the bleeding stopped.  This occurred on his medial left ankle  No past medical history on file.  There are no problems to display for this patient.   No past surgical history on file.  Prior to Admission medications   Medication Sig Start Date End Date Taking? Authorizing Provider  furosemide (LASIX) 20 MG tablet Take 1 tablet (20 mg total) by mouth daily. 03/02/20 03/02/21  Minna Antis, MD  magic mouthwash SOLN Take 5 mLs by mouth 3 (three) times daily as needed for mouth pain. 07/31/16   Irean Hong, MD  naloxone [facility]) nasal spray 4 mg/0.1 mL To use in the event of heroin overdose evidenced by decreased breathing, lack of breathing, unresponsiveness. 11/16/16   Phineas Semen, MD     Allergies Patient has no known allergies.  Family History  Family history unknown: Yes    Social History Social History   Tobacco Use   Smoking status: Every Day    Packs/day: 1.00    Types: Cigarettes   Smokeless tobacco: Never  Vaping Use   Vaping Use: Never used  Substance Use Topics   Alcohol use: No   Drug use: No    Review of Systems  Constitutional: No fever/chills     Musculoskeletal: Negative for ankle pain Skin: As above     ____________________________________________   PHYSICAL EXAM:  VITAL SIGNS: ED Triage Vitals  Enc Vitals Group     BP 05/05/21 1453 135/70     Pulse Rate 05/05/21 1453 (!) 119     Resp 05/05/21 1453 18     Temp 05/05/21 1453 98.8 F (37.1 C)     Temp Source  05/05/21 1453 Oral     SpO2 05/05/21 1453 97 %     Weight 05/05/21 1454 96.2 kg (212 lb)     Height 05/05/21 1454 1.88 m (6\' 2" )     Head Circumference --      Peak Flow --      Pain Score 05/05/21 1453 0     Pain Loc --      Pain Edu? --      Excl. in GC? --      Constitutional: Alert and oriented. No acute distress. Pleasant and interactive Eyes: Conjunctivae are normal.  Head: Atraumatic. Nose: No congestion/rhinnorhea. Mouth/Throat: Mucous membranes are moist.   Cardiovascular: Normal rate, regular rhythm.  Respiratory: Normal respiratory effort.  No retractions. Genitourinary: deferred Musculoskeletal: No lower extremity tenderness nor edema.   Neurologic:  Normal speech and language. No gross focal neurologic deficits are appreciated.   Skin:  Skin is warm, dry and intact.  Small scabbed over area on the medial left ankle, no bleeding, no redness   ____________________________________________   LABS (all labs ordered are listed, but only abnormal results are displayed)  Labs Reviewed - No data to display ____________________________________________  EKG   ____________________________________________  RADIOLOGY   ____________________________________________   PROCEDURES  Procedure(s) performed: No  Procedures   Critical Care performed: No ____________________________________________   INITIAL IMPRESSION /  ASSESSMENT AND PLAN / ED COURSE  Pertinent labs & imaging results that were available during my care of the patient were reviewed by me and considered in my medical decision making (see chart for details).   Patient well-appearing and in no acute distress.  Small area which he reports was bleeding yesterday is scabbed over and appears to be healing well.  Recommend supportive care.   ____________________________________________   FINAL CLINICAL IMPRESSION(S) / ED DIAGNOSES  Final diagnoses:  Bleeding from wound      NEW MEDICATIONS  STARTED DURING THIS VISIT:  Discharge Medication List as of 05/05/2021  5:01 PM       Note:  This document was prepared using Dragon voice recognition software and may include unintentional dictation errors.    Jene Every, MD 05/05/21 2010

## 2021-05-05 NOTE — ED Triage Notes (Signed)
Pt here with left ankle issue after his ankle began to bleed on Saturday and continued to bleed from a small wound. Wound is healing and bleeding has stopped but pt is here to be evaluated.

## 2022-05-19 IMAGING — US US EXTREM LOW VENOUS
1 series · 13 of 24 positions shown · non-contrast
Comparison: None.

CLINICAL DATA: Peripheral edema



[Series 1: us venous img lower bilat (dvt) · portal-venous · 13 of 59 slices shown]
[im 1/59]
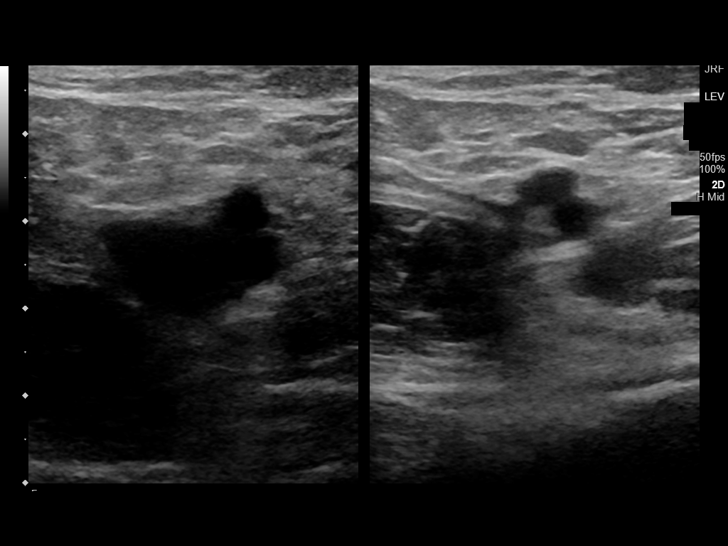
[im 6/59]
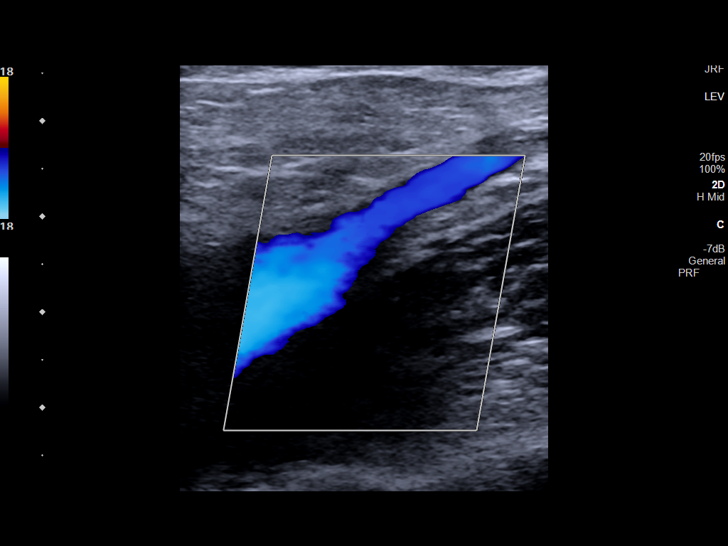
[im 11/59]
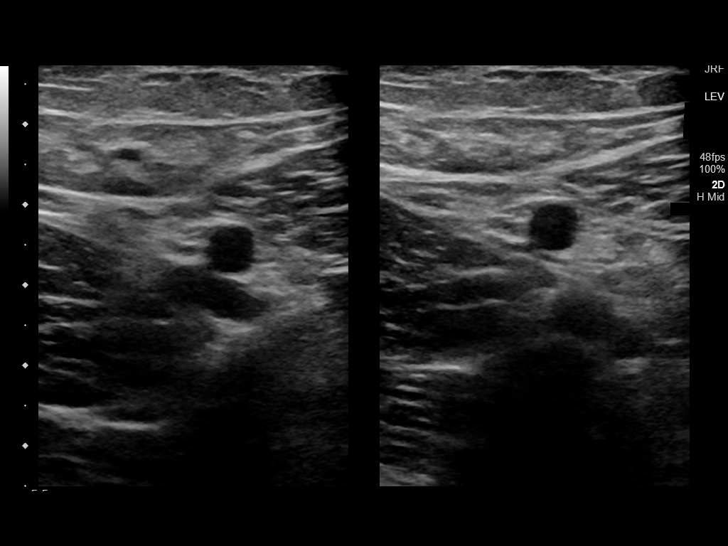
[im 16/59]
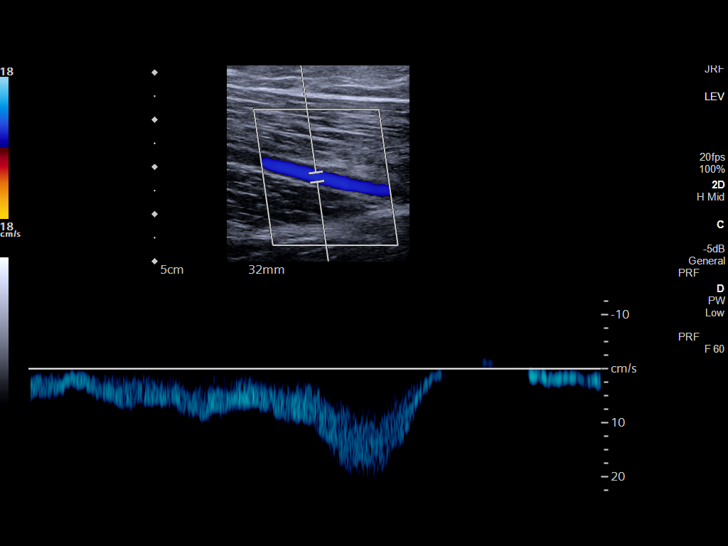
[im 21/59]
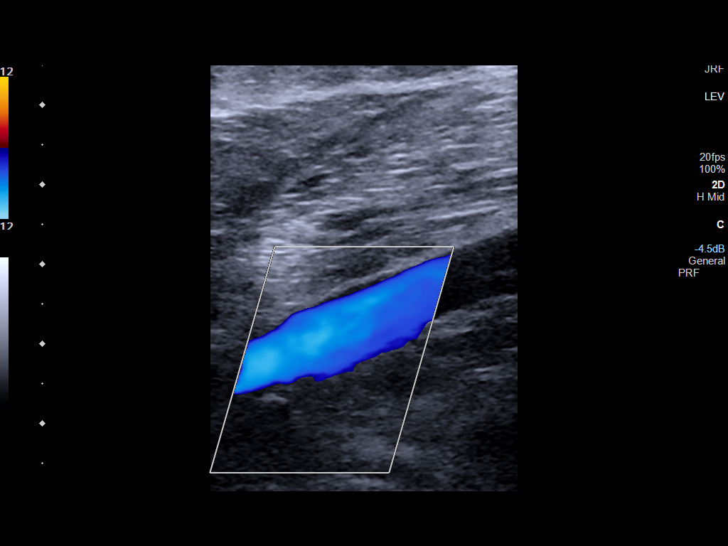
[im 26/59]
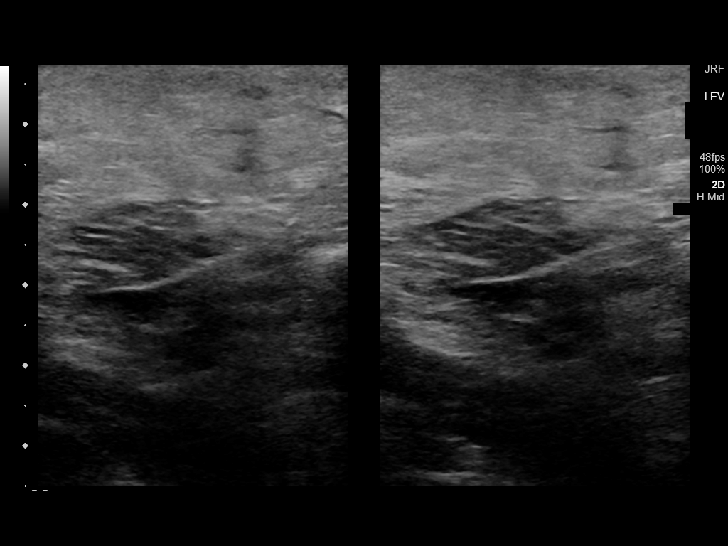
[im 31/59]
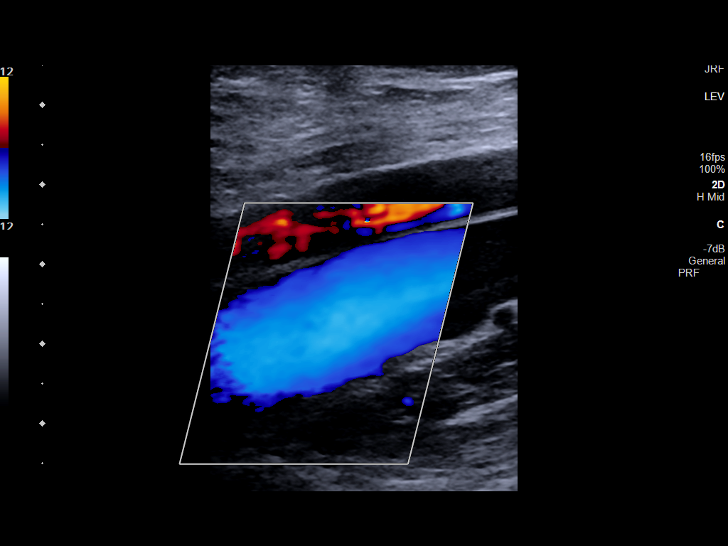
[im 33/59]
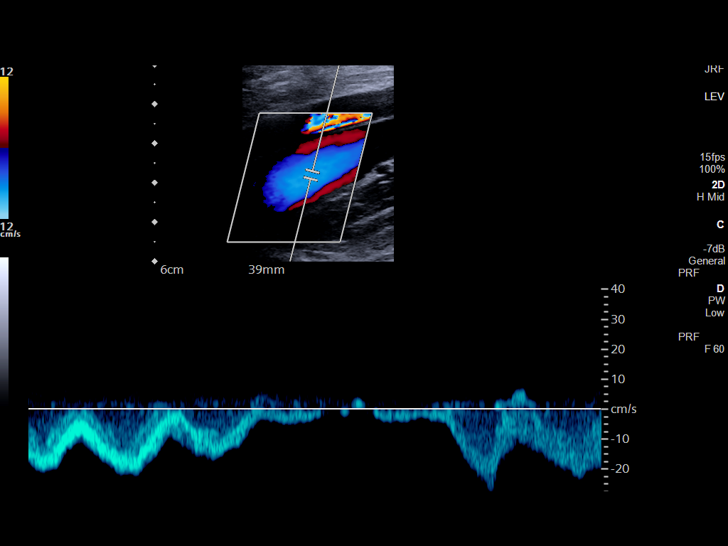
[im 38/59]
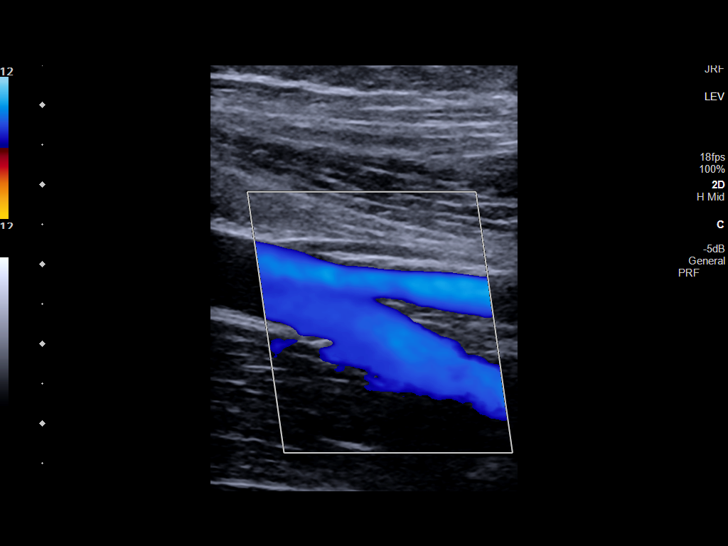
[im 43/59]
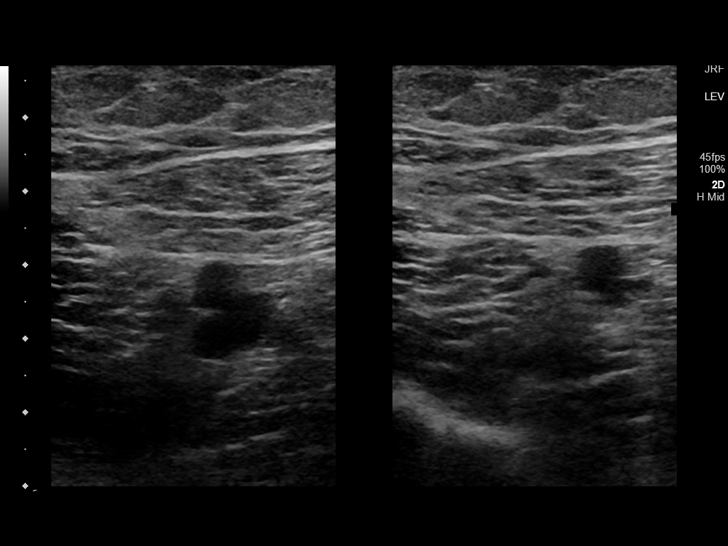
[im 48/59]
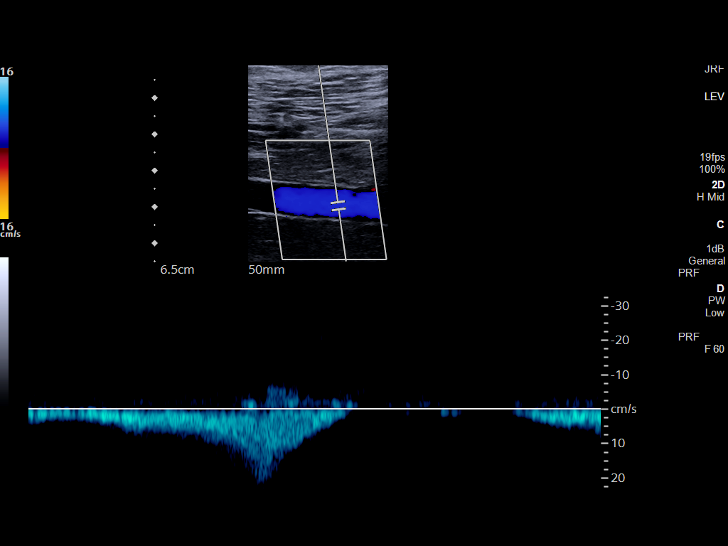
[im 53/59]
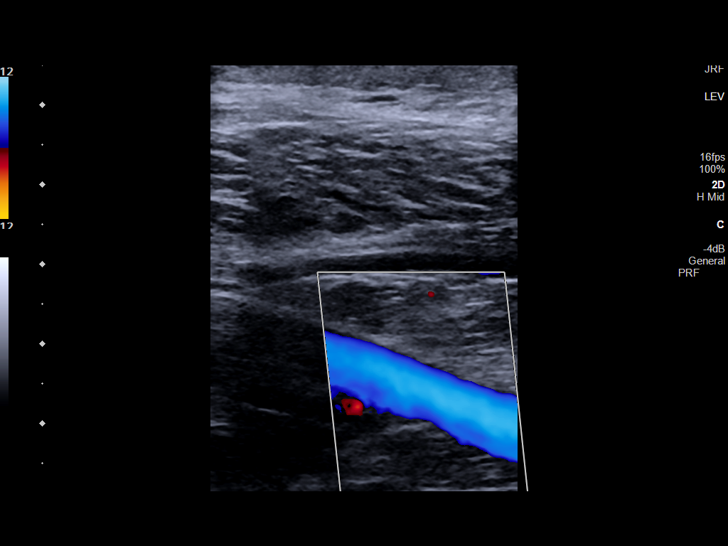
[im 59/59]
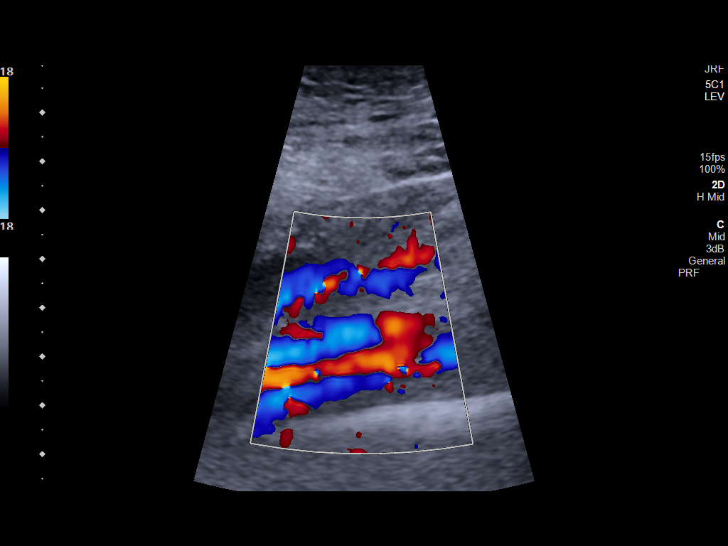

[13 of 24 positions shown; findings below may reference images not displayed]

FINDINGS: RIGHT LOWER EXTREMITY

Common Femoral Vein: No evidence of thrombus. Normal
compressibility, respiratory phasicity and response to augmentation.

Saphenofemoral Junction: No evidence of thrombus. Normal
compressibility and flow on color Doppler imaging.

Profunda Femoral Vein: No evidence of thrombus. Normal
compressibility and flow on color Doppler imaging.

Femoral Vein: No evidence of thrombus. Normal compressibility,
respiratory phasicity and response to augmentation.

Popliteal Vein: No evidence of thrombus. Normal compressibility,
respiratory phasicity and response to augmentation.

Calf Veins: No evidence of thrombus. Normal compressibility and flow
on color Doppler imaging.

Superficial Great Saphenous Vein: No evidence of thrombus. Normal
compressibility.

Venous Reflux:  None.

Other Findings:  None.

LEFT LOWER EXTREMITY

Common Femoral Vein: No evidence of thrombus. Normal
compressibility, respiratory phasicity and response to augmentation.

Saphenofemoral Junction: No evidence of thrombus. Normal
compressibility and flow on color Doppler imaging.

Profunda Femoral Vein: No evidence of thrombus. Normal
compressibility and flow on color Doppler imaging.

Femoral Vein: No evidence of thrombus. Normal compressibility,
respiratory phasicity and response to augmentation.

Popliteal Vein: No evidence of thrombus. Normal compressibility,
respiratory phasicity and response to augmentation.

Calf Veins: No evidence of thrombus. Normal compressibility and flow
on color Doppler imaging.

Superficial Great Saphenous Vein: No evidence of thrombus. Normal
compressibility.

Venous Reflux:  None.

Other Findings:  None.
IMPRESSION: No evidence of deep venous thrombosis in either lower extremity.
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
